# Patient Record
Sex: Female | Born: 1965 | Race: Black or African American | Hispanic: No | Marital: Married | Smoking: Never smoker
Health system: Southern US, Community
[De-identification: ages and names within clinical notes are randomized; demographics above are authoritative.]

## PROBLEM LIST (undated history)

## (undated) DIAGNOSIS — I10 Essential (primary) hypertension: Secondary | ICD-10-CM

## (undated) HISTORY — DX: Essential (primary) hypertension: I10

## (undated) HISTORY — PX: THYROID SURGERY: SHX805

---

## 2013-09-25 DIAGNOSIS — E042 Nontoxic multinodular goiter: Secondary | ICD-10-CM | POA: Insufficient documentation

## 2013-09-25 DIAGNOSIS — Z862 Personal history of diseases of the blood and blood-forming organs and certain disorders involving the immune mechanism: Secondary | ICD-10-CM | POA: Insufficient documentation

## 2015-09-16 DIAGNOSIS — J45909 Unspecified asthma, uncomplicated: Secondary | ICD-10-CM | POA: Insufficient documentation

## 2020-02-01 ENCOUNTER — Encounter (HOSPITAL_COMMUNITY): Payer: Self-pay | Admitting: Emergency Medicine

## 2020-02-01 ENCOUNTER — Emergency Department (HOSPITAL_COMMUNITY)
Admission: EM | Admit: 2020-02-01 | Discharge: 2020-02-01 | Disposition: A | Discharge status: Home / Self Care | Payer: Self-pay | Attending: Emergency Medicine | Admitting: Emergency Medicine

## 2020-02-01 ENCOUNTER — Other Ambulatory Visit: Payer: Self-pay

## 2020-02-01 ENCOUNTER — Emergency Department (HOSPITAL_COMMUNITY): Payer: Self-pay

## 2020-02-01 DIAGNOSIS — Y9301 Activity, walking, marching and hiking: Secondary | ICD-10-CM | POA: Insufficient documentation

## 2020-02-01 DIAGNOSIS — W010XXA Fall on same level from slipping, tripping and stumbling without subsequent striking against object, initial encounter: Secondary | ICD-10-CM | POA: Insufficient documentation

## 2020-02-01 DIAGNOSIS — Y999 Unspecified external cause status: Secondary | ICD-10-CM | POA: Insufficient documentation

## 2020-02-01 DIAGNOSIS — Z880 Allergy status to penicillin: Secondary | ICD-10-CM | POA: Insufficient documentation

## 2020-02-01 DIAGNOSIS — R519 Headache, unspecified: Secondary | ICD-10-CM | POA: Insufficient documentation

## 2020-02-01 DIAGNOSIS — Y92511 Restaurant or cafe as the place of occurrence of the external cause: Secondary | ICD-10-CM | POA: Insufficient documentation

## 2020-02-01 DIAGNOSIS — W19XXXA Unspecified fall, initial encounter: Secondary | ICD-10-CM

## 2020-02-01 DIAGNOSIS — S0083XA Contusion of other part of head, initial encounter: Secondary | ICD-10-CM | POA: Insufficient documentation

## 2020-02-01 MED ORDER — ACETAMINOPHEN 500 MG PO TABS: 1000.0000 mg | ORAL_TABLET | Freq: Once | ORAL | Status: DC

## 2020-02-01 NOTE — ED Provider Notes (Addendum)
[facility]-EMERGENCY DEPT Provider Note   CSN: [id number] Arrival date & time: 02/01/20  2034     History Chief Complaint  Patient presents with  . Fall    Laura Schultz is a 54 y.o. female who presents for evaluation after a mechanical fall.  Patient brought in by EMS from [place] [facility].  She reports that she was walking and states that her feet slipped, causing her to fall.  She reports that she landed on her left side of her face and hit the floor with her head.  She did not lose consciousness.  She reports that she had no preceding chest pain or dizziness prior to onset of fall.  She states that since then, she has had pain to the left side of her head as well as her left side of her face.  She is not on blood thinners.  She denies any vision changes, nausea/vomiting, numbness/weakness, chest pain, difficulty breathing.  The history is provided by the patient.       History reviewed. No pertinent past medical history.  There are no problems to display for this patient.   History reviewed. No pertinent surgical history.   OB History   No obstetric history on file.     History reviewed. No pertinent family history.  Social History   Tobacco Use  . Smoking status: Never Smoker  . Smokeless tobacco: Never Used  Substance Use Topics  . Alcohol use: Not Currently  . Drug use: Not Currently    Home Medications Prior to Admission medications   Not on File    Allergies    Amoxicillin  Review of Systems   Review of Systems  HENT:       Facial pain  Eyes: Negative for visual disturbance.  Gastrointestinal: Negative for nausea and vomiting.  Neurological: Positive for headaches. Negative for weakness and numbness.  All other systems reviewed and are negative.   Physical Exam Updated Vital Signs BP (!) 143/81 (BP Location: Left Arm)   Pulse 88   Temp 98.2 F (36.8 C) (Oral)   Resp 16   Ht 5\' 2"  (1.575 m)   Wt 72.1 kg   SpO2 100%    BMI 29.08 kg/m   Physical Exam Vitals and nursing note reviewed.  Constitutional:      Appearance: Normal appearance. She is well-developed.  HENT:     Head: Normocephalic and atraumatic.      Comments: Tenderness palpation noted today the left temporal region that extends to the zygomatic arch and left mandible.  She also has some tenderness palpation noted to inferior periorbital region. Eyes:     General: Lids are normal.     Conjunctiva/sclera: Conjunctivae normal.     Pupils: Pupils are equal, round, and reactive to light.     Comments: PERRL. EOMs intact. No nystagmus. No neglect.   Neck:     Comments: Tenderness palpation no midline cervical spine.  No deformity or crepitus noted. Cardiovascular:     Rate and Rhythm: Normal rate and regular rhythm.     Pulses: Normal pulses.     Heart sounds: Normal heart sounds. No murmur. No friction rub. No gallop.   Pulmonary:     Effort: Pulmonary effort is normal.     Breath sounds: Normal breath sounds.  Abdominal:     Palpations: Abdomen is soft. Abdomen is not rigid.     Tenderness: There is no abdominal tenderness. There is no guarding.  Musculoskeletal:  General: Normal range of motion.  Skin:    General: Skin is warm and dry.     Capillary Refill: Capillary refill takes less than 2 seconds.  Neurological:     Mental Status: She is alert and oriented to person, place, and time.     Comments: Cranial nerves III-XII intact Follows commands, Moves all extremities  5/5 strength to BUE and BLE  Sensation intact throughout all major nerve distributions No gait abnormalities  No slurred speech. No facial droop.   Psychiatric:        Speech: Speech normal.     ED Results / Procedures / Treatments   Labs (all labs ordered are listed, but only abnormal results are displayed) Labs Reviewed - No data to display  EKG None  Radiology CT Head Wo Contrast  Result Date: 02/01/2020 CLINICAL DATA:  Fall with left facial  strike EXAM: CT HEAD WITHOUT CONTRAST CT MAXILLOFACIAL WITHOUT CONTRAST CT CERVICAL SPINE WITHOUT CONTRAST TECHNIQUE: Multidetector CT imaging of the head, cervical spine, and maxillofacial structures were performed using the standard protocol without intravenous contrast. Multiplanar CT image reconstructions of the cervical spine and maxillofacial structures were also generated. COMPARISON:  None. FINDINGS: CT HEAD FINDINGS Brain: No evidence of acute infarction, hemorrhage, hydrocephalus, extra-axial collection or mass lesion/mass effect. Vascular: Atherosclerotic calcification of the carotid siphons. No hyperdense vessel. Skull: No calvarial fracture or suspicious osseous lesion. No scalp swelling or hematoma. Other: None CT MAXILLOFACIAL FINDINGS Osseous: No fracture of the bony orbits. Nasal bones are intact. No mid face fractures are seen. The pterygoid plates are intact. The mandible is intact. Temporomandibular joints are normally aligned. No temporal bone fractures are identified. No fractured or avulsed teeth. Orbits: Left periorbital swelling and palpebral thickening without retro septal gas, stranding or hemorrhage. The globes appear normal and symmetric. Symmetric appearance of the extraocular musculature and optic nerve sheath complexes. Normal caliber of the superior ophthalmic veins. Sinuses: Paranasal sinuses and mastoid air cells are predominantly clear. Middle ear cavities are clear. Ossicular chains appear normally configured. Soft tissues: There is left periorbital soft tissue swelling and palpebral thickening which extends to the medial nasal bridge on the left. No soft tissue gas or foreign body. No other significant swelling or contusive change. CT CERVICAL SPINE FINDINGS Alignment: Stabilization collar absent at the time of examination. There is notable neck flexion and slight rightward cranial rotation best appreciated on scout imaging. Resulting reversal of the normal cervical lordosis  with an apex at the C5-6 level. No evidence of traumatic listhesis. No abnormally widened, perched or jumped facets. Normal alignment of the craniocervical and atlantoaxial articulations. 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 This is an anatomic variant. No abnormal widening of the atlantodental interval. Spondylitic changes in the spine are present most notably at C5-6 with some larger anterior osteophytes. Vacuum phenomenon seen in the superior endplate C7. No acute fracture or vertebral body height loss. No visible skull base fractures. Soft tissues and spinal canal: No pre or paravertebral fluid or swelling. No visible canal hematoma. Disc levels: Multilevel intervertebral disc height loss with spondylitic endplate changes. Features maximal C5-6 and C6-7 with at most mild canal narrowing secondary to posterior disc osteophyte complexes. Uncinate spurring facet hypertrophic change results in at most mild multilevel neural foraminal narrowing throughout the cervical spine as well. Upper chest: No acute abnormality in the upper chest or imaged lung apices. Other: Diminutive appearance of the right  lobe thyroid gland. No concerning thyroid nodules. IMPRESSION: 1. No acute intracranial abnormality. 2. Left periorbital soft tissue swelling and palpebral thickening. No evidence of orbital or facial bone fracture. 3. No evidence of acute fracture or traumatic listhesis of the cervical spine. 4. 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 This is an anatomic variant. 5. Spondylitic changes and facet degenerative changes of the cervical spine, detailed above. No severe canal or foraminal narrowing. Electronically Signed   By: Kreg Shropshire M.D.   On: 02/01/2020 22:22   CT Cervical Spine Wo Contrast  Result Date: 02/01/2020 CLINICAL DATA:  Fall with left facial strike EXAM: CT HEAD WITHOUT CONTRAST CT  MAXILLOFACIAL WITHOUT CONTRAST CT CERVICAL SPINE WITHOUT CONTRAST TECHNIQUE: Multidetector CT imaging of the head, cervical spine, and maxillofacial structures were performed using the standard protocol without intravenous contrast. Multiplanar CT image reconstructions of the cervical spine and maxillofacial structures were also generated. COMPARISON:  None. FINDINGS: CT HEAD FINDINGS Brain: No evidence of acute infarction, hemorrhage, hydrocephalus, extra-axial collection or mass lesion/mass effect. Vascular: Atherosclerotic calcification of the carotid siphons. No hyperdense vessel. Skull: No calvarial fracture or suspicious osseous lesion. No scalp swelling or hematoma. Other: None CT MAXILLOFACIAL FINDINGS Osseous: No fracture of the bony orbits. Nasal bones are intact. No mid face fractures are seen. The pterygoid plates are intact. The mandible is intact. Temporomandibular joints are normally aligned. No temporal bone fractures are identified. No fractured or avulsed teeth. Orbits: Left periorbital swelling and palpebral thickening without retro septal gas, stranding or hemorrhage. The globes appear normal and symmetric. Symmetric appearance of the extraocular musculature and optic nerve sheath complexes. Normal caliber of the superior ophthalmic veins. Sinuses: Paranasal sinuses and mastoid air cells are predominantly clear. Middle ear cavities are clear. Ossicular chains appear normally configured. Soft tissues: There is left periorbital soft tissue swelling and palpebral thickening which extends to the medial nasal bridge on the left. No soft tissue gas or foreign body. No other significant swelling or contusive change. CT CERVICAL SPINE FINDINGS Alignment: Stabilization collar absent at the time of examination. There is notable neck flexion and slight rightward cranial rotation best appreciated on scout imaging. Resulting reversal of the normal cervical lordosis with an apex at the C5-6 level. No evidence  of traumatic listhesis. No abnormally widened, perched or jumped facets. Normal alignment of the craniocervical and atlantoaxial articulations. 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 This is an anatomic variant. No abnormal widening of the atlantodental interval. Spondylitic changes in the spine are present most notably at C5-6 with some larger anterior osteophytes. Vacuum phenomenon seen in the superior endplate C7. No acute fracture or vertebral body height loss. No visible skull base fractures. Soft tissues and spinal canal: No pre or paravertebral fluid or swelling. No visible canal hematoma. Disc levels: Multilevel intervertebral disc height loss with spondylitic endplate changes. Features maximal C5-6 and C6-7 with at most mild canal narrowing secondary to posterior disc osteophyte complexes. Uncinate spurring facet hypertrophic change results in at most mild multilevel neural foraminal narrowing throughout the cervical spine as well. Upper chest: No acute abnormality in the upper chest or imaged lung apices. Other: Diminutive appearance of the right lobe thyroid gland. No concerning thyroid nodules. IMPRESSION: 1. No acute intracranial abnormality. 2. Left periorbital soft tissue swelling and palpebral thickening. No evidence of orbital or facial bone fracture. 3. No evidence of acute fracture or  traumatic listhesis of the cervical spine. 4. 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 This is an anatomic variant. 5. Spondylitic changes and facet degenerative changes of the cervical spine, detailed above. No severe canal or foraminal narrowing. Electronically Signed   By: Lovena Le M.D.   On: 02/01/2020 22:22   CT Maxillofacial Wo Contrast  Result Date: 02/01/2020 CLINICAL DATA:  Fall with left facial strike EXAM: CT HEAD WITHOUT CONTRAST CT MAXILLOFACIAL WITHOUT CONTRAST CT CERVICAL SPINE WITHOUT  CONTRAST TECHNIQUE: Multidetector CT imaging of the head, cervical spine, and maxillofacial structures were performed using the standard protocol without intravenous contrast. Multiplanar CT image reconstructions of the cervical spine and maxillofacial structures were also generated. COMPARISON:  None. FINDINGS: CT HEAD FINDINGS Brain: No evidence of acute infarction, hemorrhage, hydrocephalus, extra-axial collection or mass lesion/mass effect. Vascular: Atherosclerotic calcification of the carotid siphons. No hyperdense vessel. Skull: No calvarial fracture or suspicious osseous lesion. No scalp swelling or hematoma. Other: None CT MAXILLOFACIAL FINDINGS Osseous: No fracture of the bony orbits. Nasal bones are intact. No mid face fractures are seen. The pterygoid plates are intact. The mandible is intact. Temporomandibular joints are normally aligned. No temporal bone fractures are identified. No fractured or avulsed teeth. Orbits: Left periorbital swelling and palpebral thickening without retro septal gas, stranding or hemorrhage. The globes appear normal and symmetric. Symmetric appearance of the extraocular musculature and optic nerve sheath complexes. Normal caliber of the superior ophthalmic veins. Sinuses: Paranasal sinuses and mastoid air cells are predominantly clear. Middle ear cavities are clear. Ossicular chains appear normally configured. Soft tissues: There is left periorbital soft tissue swelling and palpebral thickening which extends to the medial nasal bridge on the left. No soft tissue gas or foreign body. No other significant swelling or contusive change. CT CERVICAL SPINE FINDINGS Alignment: Stabilization collar absent at the time of examination. There is notable neck flexion and slight rightward cranial rotation best appreciated on scout imaging. Resulting reversal of the normal cervical lordosis with an apex at the C5-6 level. No evidence of traumatic listhesis. No abnormally widened, perched or  jumped facets. Normal alignment of the craniocervical and atlantoaxial articulations. 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 This is an anatomic variant. No abnormal widening of the atlantodental interval. Spondylitic changes in the spine are present most notably at C5-6 with some larger anterior osteophytes. Vacuum phenomenon seen in the superior endplate C7. No acute fracture or vertebral body height loss. No visible skull base fractures. Soft tissues and spinal canal: No pre or paravertebral fluid or swelling. No visible canal hematoma. Disc levels: Multilevel intervertebral disc height loss with spondylitic endplate changes. Features maximal C5-6 and C6-7 with at most mild canal narrowing secondary to posterior disc osteophyte complexes. Uncinate spurring facet hypertrophic change results in at most mild multilevel neural foraminal narrowing throughout the cervical spine as well. Upper chest: No acute abnormality in the upper chest or imaged lung apices. Other: Diminutive appearance of the right lobe thyroid gland. No concerning thyroid nodules. IMPRESSION: 1. No acute intracranial abnormality. 2. Left periorbital soft tissue swelling and palpebral thickening. No evidence of orbital or facial bone fracture. 3. No evidence of acute fracture or traumatic listhesis of the cervical spine. 4. 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 This is an anatomic variant. 5. Spondylitic changes and facet degenerative changes of the cervical spine, detailed above.  No severe canal or foraminal narrowing. Electronically Signed   By: Kreg ShropshirePrice  DeHay M.D.   On: 02/01/2020 22:22    Procedures Procedures (including critical care time)  Medications Ordered in ED Medications  acetaminophen (TYLENOL) tablet 1,000 mg (has no administration in time range)    ED Course  I have reviewed the triage vital signs and the  nursing notes.  Pertinent labs & imaging results that were available during my care of the patient were reviewed by me and considered in my medical decision making (see chart for details).    MDM Rules/Calculators/A&P                      54 year old female who presents for evaluation after mechanical fall.  Reports that she slipped and landed on her left side of her face and head.  No preceding chest pain or dizziness.  She is not on blood thinners.  Denies any LOC.  On initial arrival, she is afebrile, nontoxic-appearing.  Vital signs are stable.  No neuro deficits.  She has significant tenderness into the left side of her head as well as her face into her periorbital region and zygomatic arch.  Given extreme tenderness, will plan for imaging for evaluation.  CT scans show no acute intracranial normality.  She has left periorbital soft tissue swelling and palpable thickening.  No evidence of fracture.  No evidence of acute fracture or traumatic injury of the C-spine.  Discussed results with patient.  Encouraged at home supportive care measures. At this time, patient exhibits no emergent life-threatening condition that require further evaluation in ED or admission. Patient had ample opportunity for questions and discussion. All patient's questions were answered with full understanding. Strict return precautions discussed. Patient expresses understanding and agreement to plan.   Portions of this note were generated with Scientist, clinical (histocompatibility and immunogenetics)Dragon dictation software. Dictation errors may occur despite best attempts at proofreading.  Final Clinical Impression(s) / ED Diagnoses Final diagnoses:  Fall, initial encounter  Contusion of face, initial encounter    Rx / DC Orders ED Discharge Orders    None       Maxwell CaulLayden, Tadarrius Burch A, PA-C 02/01/20 2304    Maxwell CaulLayden, Anel Creighton A, PA-C 02/01/20 2305    Terrilee FilesButler, Michael C, MD 02/02/20 [phone]

## 2020-02-01 NOTE — Discharge Instructions (Addendum)
As we discussed, your imaging looked reassuring.  You will likely have more swelling tomorrow.  He can apply ice to help with swelling.  You can take Tylenol or Ibuprofen as directed for pain. You can alternate Tylenol and Ibuprofen every 4 hours. If you take Tylenol at 1pm, then you can take Ibuprofen at 5pm. Then you can take Tylenol again at 9pm.   Follow-up with [facility] to establish a primary care doctor if you do not have one.   Return to the emergency department for any worsening pain, nausea/vomiting, numbness/weakness or any other worsening concerning events.

## 2020-02-01 NOTE — ED Triage Notes (Signed)
Patient came from [place] [facility]. Patient feet slipped off in front of her. Patient fell on buttocks and then the left side of face hit the floor. Patient did not loose consciousness and or dizziness. The only complaint is a headache.

## 2020-02-01 NOTE — ED Notes (Signed)
Pt taken to CT.

## 2020-02-29 NOTE — Progress Notes (Deleted)
   Subjective:    Patient ID: Laura Schultz, female    DOB: 1966/04/25, 53 y.o.   MRN: [id number]  54 y.o.M here to establish s/p fall in 01/2020     Review of Systems     Objective:   Physical Exam        Assessment & Plan:

## 2020-03-01 ENCOUNTER — Ambulatory Visit: Payer: Self-pay | Admitting: Critical Care Medicine

## 2020-03-20 NOTE — Progress Notes (Signed)
Subjective:    Patient ID: Laura Schultz, female    DOB: 03-07-1966, 54 y.o.   MRN: [id number]  54 y.o.F here for post hosp f/u Patient was in the urgent care July 15 with complaints of malaise and fatigue.  Thyroid function complete blood count and metabolic panel were all normal and urinalysis was normal EKG also was normal  The patient noted that she had previously had a fall sliding and indoor restaurant on a wet floor and hitting her head face and shoulder.  She has been to the urgent care couple times since that initial visit.  She had a CT scan of her head neck and face without significant fracture seen.  Patient states she still having some pain in the top of her head still having left facial discomfort.  The patient is here to establish for primary care at this time.  She has no known history of hypertension but comes in today with blood pressure 154/81 saturations 100% room air   No past medical history on file.   No family history on file.   Social History   Socioeconomic History  . Marital status: Married    Spouse name: Not on file  . Number of children: Not on file  . Years of education: Not on file  . Highest education level: Not on file  Occupational History  . Not on file  Tobacco Use  . Smoking status: Never Smoker  . Smokeless tobacco: Never Used  Vaping Use  . Vaping Use: Never used  Substance and Sexual Activity  . Alcohol use: Not Currently  . Drug use: Not Currently  . Sexual activity: Not on file  Other Topics Concern  . Not on file  Social History Narrative  . Not on file   Social Determinants of Health   Financial Resource Strain:   . Difficulty of Paying Living Expenses:   Food Insecurity:   . Worried About Programme researcher, broadcasting/film/video in the Last Year:   . Barista in the Last Year:   Transportation Needs:   . Freight forwarder (Medical):   Marland Kitchen Lack of Transportation (Non-Medical):   Physical Activity:   . Days of Exercise per  Week:   . Minutes of Exercise per Session:   Stress:   . Feeling of Stress :   Social Connections:   . Frequency of Communication with Friends and Family:   . Frequency of Social Gatherings with Friends and Family:   . Attends Religious Services:   . Active Member of Clubs or Organizations:   . Attends Banker Meetings:   Marland Kitchen Marital Status:   Intimate Partner Violence:   . Fear of Current or Ex-Partner:   . Emotionally Abused:   Marland Kitchen Physically Abused:   . Sexually Abused:      Allergies  Allergen Reactions  . Amoxicillin Swelling     Outpatient Medications Prior to Visit  Medication Sig Dispense Refill  . ibuprofen (ADVIL) 200 MG tablet Take 200 mg by mouth every 6 (six) hours as needed.     No facility-administered medications prior to visit.      Review of Systems  Constitutional: Positive for activity change and fatigue. Negative for fever.  HENT: Positive for facial swelling. Negative for congestion, dental problem, drooling, ear discharge, ear pain, hearing loss, mouth sores, nosebleeds, postnasal drip and rhinorrhea.   Eyes: Negative.   Respiratory: Negative.   Cardiovascular: Negative.   Gastrointestinal: Negative.  Endocrine: Negative.   Genitourinary: Negative.   Musculoskeletal: Negative.   Neurological: Positive for headaches.  Hematological: Negative.   Psychiatric/Behavioral: Negative.        Objective:   Physical Exam Vitals:   03/21/20 0954  BP: (!) 154/81  Pulse: 67  Resp: 16  Temp: (!) 97.5 F (36.4 C)  SpO2: 100%  Weight: 160 lb (72.6 kg)    Gen: Pleasant, well-nourished, in no distress,  normal affect  ENT: No lesions,  mouth clear,  oropharynx clear, no postnasal drip  Neck: No JVD, no TMG, no carotid bruits  Lungs: No use of accessory muscles, no dullness to percussion, clear without rales or rhonchi  Cardiovascular: RRR, heart sounds normal, no murmur or gallops, no peripheral edema  Abdomen: soft and NT, no HSM,   BS normal  Musculoskeletal: No deformities, no cyanosis or clubbing  Neuro: alert, non focal  Skin: Warm, no lesions or rashes        Assessment & Plan:  I personally reviewed all images and lab data in the [facility] system as well as any outside material available during this office visit and agree with the  radiology impressions.   Head trauma History of head trauma previously but at this stage no evidence of chronic sequelae  I counseled the patient to use ibuprofen only for her headaches no additional work-up is indicated   Diagnoses and all orders for this visit:  Traumatic injury of head, sequela  Need for hepatitis C screening test -     Hepatitis C antibody  Encounter for screening mammogram for malignant neoplasm of breast -     MM DIGITAL SCREENING BILATERAL; Future  Colon cancer screening -     Fecal occult blood, imunochemical  Encounter for screening for HIV -     HIV Antibody (routine testing w rflx)   From a primary care perspective plan will be for this patient to obtain fecal occult blood study, hepatitis C, HIV study and schedule a mammogram  Plan to monitor BP

## 2020-03-21 ENCOUNTER — Ambulatory Visit: Payer: Self-pay | Attending: Critical Care Medicine | Admitting: Critical Care Medicine

## 2020-03-21 ENCOUNTER — Other Ambulatory Visit: Payer: Self-pay

## 2020-03-21 ENCOUNTER — Encounter: Payer: Self-pay | Admitting: Critical Care Medicine

## 2020-03-21 VITALS — BP 154/81 | HR 67 | Temp 97.5°F | Resp 16 | Wt 160.0 lb

## 2020-03-21 DIAGNOSIS — F0781 Postconcussional syndrome: Secondary | ICD-10-CM | POA: Insufficient documentation

## 2020-03-21 DIAGNOSIS — Z1231 Encounter for screening mammogram for malignant neoplasm of breast: Secondary | ICD-10-CM

## 2020-03-21 DIAGNOSIS — Z1159 Encounter for screening for other viral diseases: Secondary | ICD-10-CM

## 2020-03-21 DIAGNOSIS — Z114 Encounter for screening for human immunodeficiency virus [HIV]: Secondary | ICD-10-CM

## 2020-03-21 DIAGNOSIS — Z1211 Encounter for screening for malignant neoplasm of colon: Secondary | ICD-10-CM

## 2020-03-21 DIAGNOSIS — S0990XS Unspecified injury of head, sequela: Secondary | ICD-10-CM

## 2020-03-21 NOTE — Progress Notes (Signed)
C/o headache when she bends her head. Swelling and tenderness to touch on the left side of face.

## 2020-03-21 NOTE — Assessment & Plan Note (Signed)
History of head trauma previously but at this stage no evidence of chronic sequelae  I counseled the patient to use ibuprofen only for her headaches no additional work-up is indicated

## 2020-03-21 NOTE — Patient Instructions (Addendum)
Do neck exercises as below Take ibuprofen as needed for pain in head and face Labs today:  Hepatitis C and HIV screen A mammogram will be ordered A fecal occult screen will be ordered A pap smear will be ordered  A Tetanus vaccine was given  Return to Dr Delford Field 2 months  See below for diet for weight loss   Neck Exercises Ask your health care provider which exercises are safe for you. Do exercises exactly as told by your health care provider and adjust them as directed. It is normal to feel mild stretching, pulling, tightness, or discomfort as you do these exercises. Stop right away if you feel sudden pain or your pain gets worse. Do not begin these exercises until told by your health care provider. Neck exercises can be important for many reasons. They can improve strength and maintain flexibility in your neck, which will help your upper back and prevent neck pain. Stretching exercises Rotation neck stretching  1. Sit in a chair or stand up. 2. Place your feet flat on the floor, shoulder width apart. 3. Slowly turn your head (rotate) to the right until a slight stretch is felt. Turn it all the way to the right so you can look over your right shoulder. Do not tilt or tip your head. 4. Hold this position for 10-30 seconds. 5. Slowly turn your head (rotate) to the left until a slight stretch is felt. Turn it all the way to the left so you can look over your left shoulder. Do not tilt or tip your head. 6. Hold this position for 10-30 seconds. Repeat __________ times. Complete this exercise __________ times a day. Neck retraction 1. Sit in a sturdy chair or stand up. 2. Look straight ahead. Do not bend your neck. 3. Use your fingers to push your chin backward (retraction). Do not bend your neck for this movement. Continue to face straight ahead. If you are doing the exercise properly, you will feel a slight sensation in your throat and a stretch at the back of your neck. 4. Hold the  stretch for 1-2 seconds. Repeat __________ times. Complete this exercise __________ times a day. Strengthening exercises Neck press 1. Lie on your back on a firm bed or on the floor with a pillow under your head. 2. Use your neck muscles to push your head down on the pillow and straighten your spine. 3. Hold the position as well as you can. Keep your head facing up (in a neutral position) and your chin tucked. 4. Slowly count to 5 while holding this position. Repeat __________ times. Complete this exercise __________ times a day. Isometrics These are exercises in which you strengthen the muscles in your neck while keeping your neck still (isometrics). 1. Sit in a supportive chair and place your hand on your forehead. 2. Keep your head and face facing straight ahead. Do not flex or extend your neck while doing isometrics. 3. Push forward with your head and neck while pushing back with your hand. Hold for 10 seconds. 4. Do the sequence again, this time putting your hand against the back of your head. Use your head and neck to push backward against the hand pressure. 5. Finally, do the same exercise on either side of your head, pushing sideways against the pressure of your hand. Repeat __________ times. Complete this exercise __________ times a day. Prone head lifts 1. Lie face-down (prone position), resting on your elbows so that your chest and upper back are  raised. 2. Start with your head facing downward, near your chest. Position your chin either on or near your chest. 3. Slowly lift your head upward. Lift until you are looking straight ahead. Then continue lifting your head as far back as you can comfortably stretch. 4. Hold your head up for 5 seconds. Then slowly lower it to your starting position. Repeat __________ times. Complete this exercise __________ times a day. Supine head lifts 1. Lie on your back (supine position), bending your knees to point to the ceiling and keeping your feet  flat on the floor. 2. Lift your head slowly off the floor, raising your chin toward your chest. 3. Hold for 5 seconds. Repeat __________ times. Complete this exercise __________ times a day. Scapular retraction 1. Stand with your arms at your sides. Look straight ahead. 2. Slowly pull both shoulders (scapulae) backward and downward (retraction) until you feel a stretch between your shoulder blades in your upper back. 3. Hold for 10-30 seconds. 4. Relax and repeat. Repeat __________ times. Complete this exercise __________ times a day. Contact a health care provider if:  Your neck pain or discomfort gets much worse when you do an exercise.  Your neck pain or discomfort does not improve within 2 hours after you exercise. If you have any of these problems, stop exercising right away. Do not do the exercises again unless your health care provider says that you can. Get help right away if:  You develop sudden, severe neck pain. If this happens, stop exercising right away. Do not do the exercises again unless your health care provider says that you can. This information is not intended to replace advice given to you by your health care provider. Make sure you discuss any questions you have with your health care provider. Document Revised: 06/11/2018 Document Reviewed: 06/11/2018 Elsevier Patient Education  2020 Elsevier Inc.   Preventing Unhealthy Kinder Morgan EnergyWeight Gain, Adult Staying at a healthy weight is important to your overall health. When fat builds up in your body, you may become overweight or obese. Being overweight or obese increases your risk of developing certain health problems, such as heart disease, diabetes, sleeping problems, joint problems, and some types of cancer. Unhealthy weight gain is often the result of making unhealthy food choices or not getting enough exercise. You can make changes to your lifestyle to prevent obesity and stay as healthy as possible. What nutrition changes can  be made?   Eat only as much as your body needs. To do this: ? Pay attention to signs that you are hungry or full. Stop eating as soon as you feel full. ? If you feel hungry, try drinking water first before eating. Drink enough water so your urine is clear or pale yellow. ? Eat smaller portions. Pay attention to portion sizes when eating out. ? Look at serving sizes on food labels. Most foods contain more than one serving per container. ? Eat the recommended number of calories for your gender and activity level. For most active people, a daily total of 2,000 calories is appropriate. If you are trying to lose weight or are not very active, you may need to eat fewer calories. Talk with your health care provider or a diet and nutrition specialist (dietitian) about how many calories you need each day.  Choose healthy foods, such as: ? Fruits and vegetables. At each meal, try to fill at least half of your plate with fruits and vegetables. ? Whole grains, such as whole-wheat bread, brown rice,  and quinoa. ? Lean meats, such as chicken or fish. ? Other healthy proteins, such as beans, eggs, or tofu. ? Healthy fats, such as nuts, seeds, fatty fish, and olive oil. ? Low-fat or fat-free dairy products.  Check food labels, and avoid food and drinks that: ? Are high in calories. ? Have added sugar. ? Are high in sodium. ? Have saturated fats or trans fats.  Cook foods in healthier ways, such as by baking, broiling, or grilling.  Make a meal plan for the week, and shop with a grocery list to help you stay on track with your purchases. Try to avoid going to the grocery store when you are hungry.  When grocery shopping, try to shop around the outside of the store first, where the fresh foods are. Doing this helps you to avoid prepackaged foods, which can be high in sugar, salt (sodium), and fat. What lifestyle changes can be made?   Exercise for 30 or more minutes on 5 or more days each week.  Exercising may include brisk walking, yard work, biking, running, swimming, and team sports like basketball and soccer. Ask your health care provider which exercises are safe for you.  Do muscle-strengthening activities, such as lifting weights or using resistance bands, on 2 or more days a week.  Do not use any products that contain nicotine or tobacco, such as cigarettes and e-cigarettes. If you need help quitting, ask your health care provider.  Limit alcohol intake to no more than 1 drink a day for nonpregnant women and 2 drinks a day for men. One drink equals 12 oz of beer, 5 oz of wine, or 1 oz of hard liquor.  Try to get 7-9 hours of sleep each night. What other changes can be made?  Keep a food and activity journal to keep track of: ? What you ate and how many calories you had. Remember to count the calories in sauces, dressings, and side dishes. ? Whether you were active, and what exercises you did. ? Your calorie, weight, and activity goals.  Check your weight regularly. Track any changes. If you notice you have gained weight, make changes to your diet or activity routine.  Avoid taking weight-loss medicines or supplements. Talk to your health care provider before starting any new medicine or supplement.  Talk to your health care provider before trying any new diet or exercise plan. Why are these changes important? Eating healthy, staying active, and having healthy habits can help you to prevent obesity. Those changes also:  Help you manage stress and emotions.  Help you connect with friends and family.  Improve your self-esteem.  Improve your sleep.  Prevent long-term health problems. What can happen if changes are not made? Being obese or overweight can cause you to develop joint or bone problems, which can make it hard for you to stay active or do activities you enjoy. Being obese or overweight also puts stress on your heart and lungs and can lead to health problems  like diabetes, heart disease, and some cancers. Where to find more information Talk with your health care provider or a dietitian about healthy eating and healthy lifestyle choices. You may also find information from:  U.S. Department of Agriculture, MyPlate: https://ball-collins.biz/  American Heart Association: www.heart.org  Centers for Disease Control and Prevention: FootballExhibition.com.br Summary  Staying at a healthy weight is important to your overall health. It helps you to prevent certain diseases and health problems, such as heart disease, diabetes, joint problems,  sleep disorders, and some types of cancer.  Being obese or overweight can cause you to develop joint or bone problems, which can make it hard for you to stay active or do activities you enjoy.  You can prevent unhealthy weight gain by eating a healthy diet, exercising regularly, not smoking, limiting alcohol, and getting enough sleep.  Talk with your health care provider or a dietitian for guidance about healthy eating and healthy lifestyle choices. This information is not intended to replace advice given to you by your health care provider. Make sure you discuss any questions you have with your health care provider. Document Revised: 08/16/2017 Document Reviewed: 09/19/2016 Elsevier Patient Education  2020 [employer].

## 2020-03-22 LAB — HIV ANTIBODY (ROUTINE TESTING W REFLEX): HIV Screen 4th Generation wRfx: NONREACTIVE

## 2020-03-22 LAB — HEPATITIS C ANTIBODY: Hep C Virus Ab: <0.1 s/co ratio (ref 0.0–0.9)

## 2020-03-24 ENCOUNTER — Other Ambulatory Visit: Payer: Self-pay | Admitting: Critical Care Medicine

## 2020-03-24 ENCOUNTER — Other Ambulatory Visit: Payer: Self-pay | Admitting: *Deleted

## 2020-03-24 DIAGNOSIS — Z1231 Encounter for screening mammogram for malignant neoplasm of breast: Secondary | ICD-10-CM

## 2020-03-29 ENCOUNTER — Other Ambulatory Visit: Payer: Self-pay

## 2020-03-29 ENCOUNTER — Ambulatory Visit
Admission: RE | Admit: 2020-03-29 | Discharge: 2020-03-29 | Disposition: A | Discharge status: Home / Self Care | Payer: No Typology Code available for payment source | Source: Ambulatory Visit | Attending: Obstetrics and Gynecology | Admitting: Obstetrics and Gynecology

## 2020-03-29 DIAGNOSIS — Z1231 Encounter for screening mammogram for malignant neoplasm of breast: Secondary | ICD-10-CM

## 2020-03-31 ENCOUNTER — Telehealth: Payer: Self-pay | Admitting: *Deleted

## 2020-03-31 NOTE — Telephone Encounter (Signed)
Per Dr. Delford Field, patient was called today by him and informed of mammogran results. Attempt to call mammogram program to see who would reach out to patient about f/u and next steps.

## 2020-04-01 NOTE — Telephone Encounter (Signed)
Spoke with Gunnar Fusi wit the [employer]. Per Gunnar Fusi, she will f/u with patient. Since she is uninsured, patient will need a pink card for diagnostic mammogram.

## 2020-04-01 NOTE — Telephone Encounter (Signed)
How do we get her a pink card??

## 2020-04-03 NOTE — Telephone Encounter (Signed)
THank you!

## 2020-04-04 ENCOUNTER — Other Ambulatory Visit: Payer: Self-pay | Admitting: Critical Care Medicine

## 2020-04-04 DIAGNOSIS — R5381 Other malaise: Secondary | ICD-10-CM

## 2020-04-05 ENCOUNTER — Telehealth: Payer: Self-pay

## 2020-04-05 NOTE — Telephone Encounter (Signed)
Telephoned patient at home number. Patient did not qualify for BCCCP based on the information given.

## 2020-04-06 ENCOUNTER — Telehealth: Payer: Self-pay

## 2020-04-06 LAB — FECAL OCCULT BLOOD, IMMUNOCHEMICAL: Fecal Occult Bld: NEGATIVE

## 2020-04-06 NOTE — Telephone Encounter (Signed)
Contacted pt to go over lab results pt is aware and doesn't have any questions or concerns 

## 2020-05-10 ENCOUNTER — Telehealth: Payer: Self-pay

## 2020-05-10 NOTE — Telephone Encounter (Signed)
Telephoned daughter([place]) at her mobile number. Left a voice message with BCCCP contact information.

## 2020-05-23 ENCOUNTER — Other Ambulatory Visit: Payer: Self-pay | Admitting: *Deleted

## 2020-05-23 DIAGNOSIS — N6310 Unspecified lump in the right breast, unspecified quadrant: Secondary | ICD-10-CM

## 2020-05-23 DIAGNOSIS — N6320 Unspecified lump in the left breast, unspecified quadrant: Secondary | ICD-10-CM

## 2020-05-24 ENCOUNTER — Ambulatory Visit: Payer: Self-pay | Attending: Critical Care Medicine | Admitting: Critical Care Medicine

## 2020-05-24 ENCOUNTER — Other Ambulatory Visit: Payer: Self-pay

## 2020-05-24 ENCOUNTER — Encounter: Payer: Self-pay | Admitting: Critical Care Medicine

## 2020-05-24 VITALS — BP 152/70 | HR 87 | Temp 98.2°F | Resp 16 | Wt 166.6 lb

## 2020-05-24 DIAGNOSIS — N63 Unspecified lump in unspecified breast: Secondary | ICD-10-CM

## 2020-05-24 DIAGNOSIS — N6009 Solitary cyst of unspecified breast: Secondary | ICD-10-CM | POA: Insufficient documentation

## 2020-05-24 DIAGNOSIS — F0781 Postconcussional syndrome: Secondary | ICD-10-CM

## 2020-05-24 DIAGNOSIS — I10 Essential (primary) hypertension: Secondary | ICD-10-CM

## 2020-05-24 MED ORDER — ACETAMINOPHEN 500 MG PO TABS: 500.0000 mg | ORAL_TABLET | Freq: Three times a day (TID) | ORAL | 1 refills | Status: AC

## 2020-05-24 MED ORDER — METOPROLOL SUCCINATE ER 25 MG PO TB24
25.0000 mg | ORAL_TABLET | Freq: Every day | ORAL | 3 refills | Status: AC
Start: 2020-05-24 — End: ?

## 2020-05-24 MED FILL — METOPROLOL SUCCINATE ER 25: 25 | 30 days supply | Qty: 30 | Fill #0

## 2020-05-24 NOTE — Assessment & Plan Note (Signed)
Bilateral breast mass follow-up with biopsy per breast center

## 2020-05-24 NOTE — Patient Instructions (Signed)
You declined the flu vaccine  Take tylenol one three times a day for headaches  Start metoprolol 25mg  daily for blood pressure  Fill out financial assistance paperwork asap, after this is completed we will order a neurology referral and MRI of brain  Keep your appointment with breast center for the breast biopsy  A pap smear will be scheduled

## 2020-05-24 NOTE — Assessment & Plan Note (Signed)
Begin metoprolol 25 mg daily

## 2020-05-24 NOTE — Assessment & Plan Note (Signed)
Symptom complex most consistent with postconcussive syndrome.  Patient will need financial assistance we can refer to neurology and obtain an MRI.  In the interim we will prescribe Tylenol 500 mg 3 times daily on a scheduled basis and begin metoprolol at 25 mg daily to treat blood pressure elevation and the concussive syndrome

## 2020-05-24 NOTE — Progress Notes (Signed)
Subjective:    Patient ID: Laura Schultz, female    DOB: 03-02-1966, 54 y.o.   MRN: [id number]  03/21/20 54 y.o.F here for post hosp f/u Patient was in the urgent care July 15 with complaints of malaise and fatigue.  Thyroid function complete blood count and metabolic panel were all normal and urinalysis was normal EKG also was normal  The patient noted that she had previously had a fall sliding and indoor restaurant on a wet floor and hitting her head face and shoulder.  She has been to the urgent care couple times since that initial visit.  She had a CT scan of her head neck and face without significant fracture seen.  Patient states she still having some pain in the top of her head still having left facial discomfort.  The patient is here to establish for primary care at this time.  She has no known history of hypertension but comes in today with blood pressure 154/81 saturations 100% room air  05/24/2020 Patient returns in follow-up and notes history of trauma to the head after a fall this summer.  She still having headaches when she looks up with her vision.  Patient also has hypertension and has not been treated and today comes in at 152/70.  Note also she had a screening mammogram which showed lesions in both breasts and she has an ultrasound of the axillas and diagnostic mammogram with potential biopsy upcoming in a week.  Her fecal occult studies for stool were negative.  HIV hep C were negative.  She does not have any insurance.  She appears to have a postconcussive type syndrome and we have been reluctant to order MRI on this patient so she can achieve financial assistance.  She does need a Pap smear at this time. Note TSH previously been checked was normal with prior history of multinodular goiter   No past medical history on file.   No family history on file.   Social History   Socioeconomic History  . Marital status: Married    Spouse name: Not on file  . Number of  children: Not on file  . Years of education: Not on file  . Highest education level: Not on file  Occupational History  . Not on file  Tobacco Use  . Smoking status: Never Smoker  . Smokeless tobacco: Never Used  Vaping Use  . Vaping Use: Never used  Substance and Sexual Activity  . Alcohol use: Not Currently  . Drug use: Not Currently  . Sexual activity: Not on file  Other Topics Concern  . Not on file  Social History Narrative  . Not on file   Social Determinants of Health   Financial Resource Strain:   . Difficulty of Paying Living Expenses: Not on file  Food Insecurity:   . Worried About Programme researcher, broadcasting/film/video in the Last Year: Not on file  . Ran Out of Food in the Last Year: Not on file  Transportation Needs:   . Lack of Transportation (Medical): Not on file  . Lack of Transportation (Non-Medical): Not on file  Physical Activity:   . Days of Exercise per Week: Not on file  . Minutes of Exercise per Session: Not on file  Stress:   . Feeling of Stress : Not on file  Social Connections:   . Frequency of Communication with Friends and Family: Not on file  . Frequency of Social Gatherings with Friends and Family: Not on file  .  Attends Religious Services: Not on file  . Active Member of Clubs or Organizations: Not on file  . Attends Banker Meetings: Not on file  . Marital Status: Not on file  Intimate Partner Violence:   . Fear of Current or Ex-Partner: Not on file  . Emotionally Abused: Not on file  . Physically Abused: Not on file  . Sexually Abused: Not on file     Allergies  Allergen Reactions  . Amoxicillin Swelling     Outpatient Medications Prior to Visit  Medication Sig Dispense Refill  . ibuprofen (ADVIL) 200 MG tablet Take 200 mg by mouth every 6 (six) hours as needed.     No facility-administered medications prior to visit.      Review of Systems  Constitutional: Positive for activity change and fatigue. Negative for fever.  HENT:  Positive for facial swelling. Negative for congestion, dental problem, drooling, ear discharge, ear pain, hearing loss, mouth sores, nosebleeds, postnasal drip and rhinorrhea.   Eyes: Negative.   Respiratory: Negative.   Cardiovascular: Negative.   Gastrointestinal: Negative.   Endocrine: Negative.   Genitourinary: Negative.   Musculoskeletal: Negative.   Neurological: Positive for headaches.  Hematological: Negative.   Psychiatric/Behavioral: Negative.        Objective:   Physical Exam Vitals:   05/24/20 1111  BP: (!) 152/70  Pulse: 87  Resp: 16  Temp: 98.2 F (36.8 C)  SpO2: 98%  Weight: 166 lb 9.6 oz (75.6 kg)    Gen: Pleasant, well-nourished, in no distress,  normal affect  ENT: No lesions,  mouth clear,  oropharynx clear, no postnasal drip  Neck: No JVD, no TMG, no carotid bruits  Lungs: No use of accessory muscles, no dullness to percussion, clear without rales or rhonchi  Cardiovascular: RRR, heart sounds normal, no murmur or gallops, no peripheral edema  Abdomen: soft and NT, no HSM,  BS normal  Musculoskeletal: No deformities, no cyanosis or clubbing  Neuro: alert, non focal  Skin: Warm, no lesions or rashes        Assessment & Plan:  I personally reviewed all images and lab data in the [facility] system as well as any outside material available during this office visit and agree with the  radiology impressions.   Postconcussive syndrome Symptom complex most consistent with postconcussive syndrome.  Patient will need financial assistance we can refer to neurology and obtain an MRI.  In the interim we will prescribe Tylenol 500 mg 3 times daily on a scheduled basis and begin metoprolol at 25 mg daily to treat blood pressure elevation and the concussive syndrome  Hypertension Begin metoprolol 25 mg daily  Breast mass Bilateral breast mass follow-up with biopsy per breast center   Alsie was seen today for follow-up.  Diagnoses and all orders for  this visit:  Postconcussive syndrome  Essential hypertension  Breast mass  Other orders -     acetaminophen (TYLENOL) 500 MG tablet; Take 1 tablet (500 mg total) by mouth in the morning, at noon, and at bedtime. -     metoprolol succinate (TOPROL-XL) 25 MG 24 hr tablet; Take 1 tablet (25 mg total) by mouth daily.   Patient to follow-up with breast evaluation and will obtain a Pap smear

## 2020-05-31 ENCOUNTER — Ambulatory Visit
Admission: RE | Admit: 2020-05-31 | Discharge: 2020-05-31 | Disposition: A | Discharge status: Home / Self Care | Payer: No Typology Code available for payment source | Source: Ambulatory Visit | Attending: Obstetrics and Gynecology | Admitting: Obstetrics and Gynecology

## 2020-05-31 ENCOUNTER — Ambulatory Visit: Payer: Self-pay | Admitting: *Deleted

## 2020-05-31 ENCOUNTER — Other Ambulatory Visit: Payer: Self-pay

## 2020-05-31 VITALS — BP 128/88 | Temp 97.1°F | Wt 164.4 lb

## 2020-05-31 DIAGNOSIS — N6320 Unspecified lump in the left breast, unspecified quadrant: Secondary | ICD-10-CM

## 2020-05-31 DIAGNOSIS — Z01419 Encounter for gynecological examination (general) (routine) without abnormal findings: Secondary | ICD-10-CM

## 2020-05-31 DIAGNOSIS — N6310 Unspecified lump in the right breast, unspecified quadrant: Secondary | ICD-10-CM

## 2020-05-31 NOTE — Patient Instructions (Signed)
Explained breast self awareness with Laura Schultz. Pap smear completed today. Let her know BCCCP will cover Pap smears and HPV typing every 5 years unless has a history of abnormal Pap smears. Referred patient to the [facility] for a diagnostic mammogram per recommendation. Appointment scheduled Tuesday, May 31, 2020 at 1340. Patient aware of appointment and will be there. Let patient know will follow up with her within the next couple weeks with results of her Pap smear by letter or phone. [name] verbalized understanding.  [name], Kathaleen Maser, RN 12:13 PM

## 2020-05-31 NOTE — Progress Notes (Signed)
[name] is a 54 y.o. No obstetric history on file. female who presents to [facility] clinic today with no complaints.    Pap Smear: Pap smear completed today. Last Pap smear was 09/10/2012 and was normal. Per patient has no history of an abnormal Pap smear. Last Pap smear result is available in Epic.   Physical exam: Breasts Right breast slightly larger than left breast that per patient is normal for her. No skin abnormalities bilateral breasts. No nipple retraction bilateral breasts. No nipple discharge bilateral breasts. No lymphadenopathy. No lumps palpated bilateral breasts. No complaints of pain or tenderness on exam.       Pelvic/Bimanual Ext Genitalia No lesions, no swelling and no discharge observed on external genitalia.        Vagina Vagina pink and normal texture. No lesions or discharge observed in vagina.        Cervix Cervix is present. Cervix pink and of normal texture. No discharge observed.    Uterus Uterus is present and palpable. Uterus in normal position and normal size.        Adnexae Bilateral ovaries present and palpable. No tenderness on palpation.         Rectovaginal No rectal exam completed today since patient had no rectal complaints. No skin abnormalities observed on exam.     Smoking History: Patient has never smoked.   Patient Navigation: Patient education provided. Access to services provided for patient through BCCCP program.   Colorectal Cancer Screening: Per patient has never had colonoscopy completed. FIT Test completed 04/04/2020 that was negative. No complaints today.    Breast and Cervical Cancer Risk Assessment: Patient does not have family history of breast cancer, known genetic mutations, or radiation treatment to the chest before age 70. Patient does not have history of cervical dysplasia, immunocompromised, or DES exposure in-utero.  Risk Assessment    Risk Scores      05/31/2020   Last edited by: Narda Rutherford, LPN    5-year risk: 1 %   Lifetime risk: 6.5 %          A: BCCCP exam with pap smear Patient referred to [facility] by the [facility] due to recommending additional imaging of bilateral breasts. Screening mammogram completed 03/28/2020.  P: Referred patient to the [facility] of [facility] for a diagnostic mammogram per recommendation. Appointment scheduled Tuesday, May 31, 2020 at 1340.  Priscille Heidelberg, RN 05/31/2020 12:13 PM

## 2020-06-02 LAB — CYTOLOGY - PAP
Comment: NEGATIVE
Diagnosis: NEGATIVE
High risk HPV: NEGATIVE

## 2020-06-07 ENCOUNTER — Ambulatory Visit: Payer: Self-pay | Admitting: Critical Care Medicine

## 2020-06-08 ENCOUNTER — Telehealth: Payer: Self-pay

## 2020-06-08 NOTE — Telephone Encounter (Signed)
Normal Pap/HPV result letter mailed.  

## 2020-07-28 ENCOUNTER — Ambulatory Visit: Payer: No Typology Code available for payment source | Admitting: Critical Care Medicine

## 2020-08-18 ENCOUNTER — Ambulatory Visit: Payer: Self-pay | Attending: Critical Care Medicine | Admitting: Critical Care Medicine

## 2020-08-18 ENCOUNTER — Encounter: Payer: Self-pay | Admitting: Critical Care Medicine

## 2020-08-18 ENCOUNTER — Other Ambulatory Visit: Payer: Self-pay

## 2020-08-18 DIAGNOSIS — N6009 Solitary cyst of unspecified breast: Secondary | ICD-10-CM

## 2020-08-18 DIAGNOSIS — F0781 Postconcussional syndrome: Secondary | ICD-10-CM

## 2020-08-18 DIAGNOSIS — I10 Essential (primary) hypertension: Secondary | ICD-10-CM

## 2020-08-18 NOTE — Assessment & Plan Note (Signed)
Simple breast cyst without malignancy  Follow-up mammogram screening study in 1 year

## 2020-08-18 NOTE — Progress Notes (Addendum)
Subjective:    Patient ID: Laura Schultz, female    DOB: Jan 23, 1966, 54 y.o.   MRN: [id number] Virtual Visit via Telephone Note  I connected with Laura Schultz on 10/04/20 at  9:00 AM EST by telephone and verified that I am speaking with the correct person using two identifiers.   Consent:  I discussed the limitations, risks, security and privacy concerns of performing an evaluation and management service by telephone and the availability of in person appointments. I also discussed with the patient that there may be a patient responsible charge related to this service. The patient expressed understanding and agreed to proceed.  Location of patient: Patient is at home  Location of provider: I am in my office  Persons participating in the televisit with the patient.   No one else on the call    History of Present Illness: 03/21/20 54 y.o.F here for post hosp f/u Patient was in the urgent care July 15 with complaints of malaise and fatigue.  Thyroid function complete blood count and metabolic panel were all normal and urinalysis was normal EKG also was normal  The patient noted that she had previously had a fall sliding and indoor restaurant on a wet floor and hitting her head face and shoulder.  She has been to the urgent care couple times since that initial visit.  She had a CT scan of her head neck and face without significant fracture seen.  Patient states she still having some pain in the top of her head still having left facial discomfort.  The patient is here to establish for primary care at this time.  She has no known history of hypertension but comes in today with blood pressure 154/81 saturations 100% room air  05/24/2020 Patient returns in follow-up and notes history of trauma to the head after a fall this summer.  She still having headaches when she looks up with her vision.  Patient also has hypertension and has not been treated and today comes in at 152/70.  Note also  she had a screening mammogram which showed lesions in both breasts and she has an ultrasound of the axillas and diagnostic mammogram with potential biopsy upcoming in a week.  Her fecal occult studies for stool were negative.  HIV hep C were negative.  She does not have any insurance.  She appears to have a postconcussive type syndrome and we have been reluctant to order MRI on this patient so she can achieve financial assistance.  She does need a Pap smear at this time. Note TSH previously been checked was normal with prior history of multinodular goiter  08/18/2020 Patient returns today for follow-up of postconcussive syndrome with chronic headaches and hypertension.  This is a telephone visit.  The patient does not need an interpreter.  The patient had a traumatic fall hitting her head earlier this year with a negative CT of the neck and head.  We started her on metoprolol 25 mg daily for elevated blood pressure readings.  She does not have a blood pressure meter at home to check her blood pressure.  She states her headaches are unchanged since being on the Tylenol and the metoprolol.  She does not have insurance is not have any ability to obtain an MRI at this point.    This patient has no other specific complaints at this time.    She had an evaluation of bilateral breast masses which were simple cyst on ultrasound did not require  biopsy or aspiration.  She does not need another mammogram for 1 year.  She also had a Pap smear performed and this showed no malignancy.     Past Medical History:  Diagnosis Date  . Hypertension      Family History  Problem Relation Age of Onset  . Hypertension Mother   . Healthy Father      Social History   Socioeconomic History  . Marital status: Married    Spouse name: Not on file  . Number of children: 5  . Years of education: Not on file  . Highest education level: 12th grade  Occupational History  . Not on file  Tobacco Use  . Smoking status:  Never Smoker  . Smokeless tobacco: Never Used  Vaping Use  . Vaping Use: Never used  Substance and Sexual Activity  . Alcohol use: Not Currently  . Drug use: Never  . Sexual activity: Yes    Birth control/protection: Post-menopausal  Other Topics Concern  . Not on file  Social History Narrative  . Not on file   Social Determinants of Health   Financial Resource Strain: Not on file  Food Insecurity: Not on file  Transportation Needs: No Transportation Needs  . Lack of Transportation (Medical): No  . Lack of Transportation (Non-Medical): No  Physical Activity: Not on file  Stress: Not on file  Social Connections: Not on file  Intimate Partner Violence: Not on file     Allergies  Allergen Reactions  . Amoxicillin Swelling     Outpatient Medications Prior to Visit  Medication Sig Dispense Refill  . ibuprofen (ADVIL) 200 MG tablet Take 200 mg by mouth every 6 (six) hours as needed.    . metoprolol succinate (TOPROL-XL) 25 MG 24 hr tablet Take 1 tablet (25 mg total) by mouth daily. 90 tablet 3  . acetaminophen (TYLENOL) 500 MG tablet Take 1 tablet (500 mg total) by mouth in the morning, at noon, and at bedtime. (Patient not taking: Reported on 08/18/2020) 90 tablet 1   No facility-administered medications prior to visit.      Review of Systems  Constitutional: Negative for activity change, fatigue and fever.  HENT: Negative for congestion, dental problem, drooling, ear discharge, ear pain, facial swelling, hearing loss, mouth sores, nosebleeds, postnasal drip and rhinorrhea.   Eyes: Negative.   Respiratory: Negative.   Cardiovascular: Negative.   Gastrointestinal: Negative.   Endocrine: Negative.   Genitourinary: Negative.   Musculoskeletal: Negative.   Neurological: Positive for headaches.  Hematological: Negative.   Psychiatric/Behavioral: Negative.        Objective:   Physical Exam There were no vitals filed for this visit. This is a phone visit no  exam      Assessment & Plan:  I personally reviewed all images and lab data in the [facility] system as well as any outside material available during this office visit and agree with the  radiology impressions.   Hypertension Hypertension under treatment with metoprolol low-dose  Patient instructed to obtain a home blood pressure meter and bring with her the readings from this at the next visit which we made end of January  Maintain metoprolol  Postconcussive syndrome Postconcussive syndrome with chronic headaches  Plan referral to neurology  Breast cyst, bilateral breasts- benign Simple breast cyst without malignancy  Follow-up mammogram screening study in 1 year   Laura Schultz was seen today for headache.  Diagnoses and all orders for this visit:  Postconcussive syndrome -  Ambulatory referral to Neurology  Primary hypertension  Breast cyst, unspecified laterality     Follow Up Instructions:   Patient knows a follow-up exam we made end of January face-to-face and we will make a referral to neurology for the postconcussive headaches I discussed the assessment and treatment plan with the patient. The patient was provided an opportunity to ask questions and all were answered. The patient agreed with the plan and demonstrated an understanding of the instructions.   The patient was advised to call back or seek an in-person evaluation if the symptoms worsen or if the condition fails to improve as anticipated.  I provided 30 minutes of non-face-to-face time during this encounter  including  median intraservice time , review of notes, labs, imaging, medications  and explaining diagnosis and management to the patient .    Shan Levans, MD

## 2020-08-18 NOTE — Assessment & Plan Note (Signed)
Hypertension under treatment with metoprolol low-dose  Patient instructed to obtain a home blood pressure meter and bring with her the readings from this at the next visit which we made end of January  Maintain metoprolol

## 2020-08-18 NOTE — Assessment & Plan Note (Signed)
Postconcussive syndrome with chronic headaches  Plan referral to neurology

## 2020-08-18 NOTE — Progress Notes (Signed)
Pt has been having headaches 

## 2020-08-22 ENCOUNTER — Encounter: Payer: Self-pay | Admitting: Neurology

## 2020-10-04 ENCOUNTER — Ambulatory Visit: Payer: No Typology Code available for payment source | Admitting: Critical Care Medicine

## 2020-10-04 NOTE — Progress Notes (Deleted)
Subjective:    Patient ID: Laura Schultz, female    DOB: [birth year], 55 y.o.   MRN: [id number] Virtual Visit via Telephone Note  I connected with Laura Schultz on 10/04/20 at  1:30 PM EST by telephone and verified that I am speaking with the correct person using two identifiers.   Consent:  I discussed the limitations, risks, security and privacy concerns of performing an evaluation and management service by telephone and the availability of in person appointments. I also discussed with the patient that there may be a patient responsible charge related to this service. The patient expressed understanding and agreed to proceed.  Location of patient: Patient is at home  Location of provider: I am in my office  Persons participating in the televisit with the patient.   No one else on the call    History of Present Illness: 03/21/20 54 y.o.F here for post hosp f/u Patient was in the urgent care July 15 with complaints of malaise and fatigue.  Thyroid function complete blood count and metabolic panel were all normal and urinalysis was normal EKG also was normal  The patient noted that she had previously had a fall sliding and indoor restaurant on a wet floor and hitting her head face and shoulder.  She has been to the urgent care couple times since that initial visit.  She had a CT scan of her head neck and face without significant fracture seen.  Patient states she still having some pain in the top of her head still having left facial discomfort.  The patient is here to establish for primary care at this time.  She has no known history of hypertension but comes in today with blood pressure 154/81 saturations 100% room air  05/24/2020 Patient returns in follow-up and notes history of trauma to the head after a fall this summer.  She still having headaches when she looks up with her vision.  Patient also has hypertension and has not been treated and today comes in at 152/70.  Note also  she had a screening mammogram which showed lesions in both breasts and she has an ultrasound of the axillas and diagnostic mammogram with potential biopsy upcoming in a week.  Her fecal occult studies for stool were negative.  HIV hep C were negative.  She does not have any insurance.  She appears to have a postconcussive type syndrome and we have been reluctant to order MRI on this patient so she can achieve financial assistance.  She does need a Pap smear at this time. Note TSH previously been checked was normal with prior history of multinodular goiter  08/18/2020 Patient returns today for follow-up of postconcussive syndrome with chronic headaches and hypertension.  This is a telephone visit.  The patient does not need an interpreter.  The patient had a traumatic fall hitting her head earlier this year with a negative CT of the neck and head.  We started her on metoprolol 25 mg daily for elevated blood pressure readings.  She does not have a blood pressure meter at home to check her blood pressure.  She states her headaches are unchanged since being on the Tylenol and the metoprolol.  She does not have insurance is not have any ability to obtain an MRI at this point.    This patient has no other specific complaints at this time.    She had an evaluation of bilateral breast masses which were simple cyst on ultrasound did not require  biopsy or aspiration.  She does not need another mammogram for 1 year.  She also had a Pap smear performed and this showed no malignancy.    10/04/2020  Hypertension Hypertension under treatment with metoprolol low-dose  Patient instructed to obtain a home blood pressure meter and bring with her the readings from this at the next visit which we made end of January  Maintain metoprolol  Postconcussive syndrome Postconcussive syndrome with chronic headaches  Plan referral to neurology  Breast cyst, bilateral breasts- benign Simple breast cyst without  malignancy  Follow-up mammogram screening study in 1 year    Past Medical History:  Diagnosis Date  . Hypertension      Family History  Problem Relation Age of Onset  . Hypertension Mother   . Healthy Father      Social History   Socioeconomic History  . Marital status: Married    Spouse name: Not on file  . Number of children: 5  . Years of education: Not on file  . Highest education level: 12th grade  Occupational History  . Not on file  Tobacco Use  . Smoking status: Never Smoker  . Smokeless tobacco: Never Used  Vaping Use  . Vaping Use: Never used  Substance and Sexual Activity  . Alcohol use: Not Currently  . Drug use: Never  . Sexual activity: Yes    Birth control/protection: Post-menopausal  Other Topics Concern  . Not on file  Social History Narrative  . Not on file   Social Determinants of Health   Financial Resource Strain: Not on file  Food Insecurity: Not on file  Transportation Needs: No Transportation Needs  . Lack of Transportation (Medical): No  . Lack of Transportation (Non-Medical): No  Physical Activity: Not on file  Stress: Not on file  Social Connections: Not on file  Intimate Partner Violence: Not on file     Allergies  Allergen Reactions  . Amoxicillin Swelling     Outpatient Medications Prior to Visit  Medication Sig Dispense Refill  . acetaminophen (TYLENOL) 500 MG tablet Take 1 tablet (500 mg total) by mouth in the morning, at noon, and at bedtime. (Patient not taking: Reported on 08/18/2020) 90 tablet 1  . ibuprofen (ADVIL) 200 MG tablet Take 200 mg by mouth every 6 (six) hours as needed.    . metoprolol succinate (TOPROL-XL) 25 MG 24 hr tablet Take 1 tablet (25 mg total) by mouth daily. 90 tablet 3   No facility-administered medications prior to visit.      Review of Systems  Constitutional: Negative for activity change, fatigue and fever.  HENT: Negative for congestion, dental problem, drooling, ear discharge, ear  pain, facial swelling, hearing loss, mouth sores, nosebleeds, postnasal drip and rhinorrhea.   Eyes: Negative.   Respiratory: Negative.   Cardiovascular: Negative.   Gastrointestinal: Negative.   Endocrine: Negative.   Genitourinary: Negative.   Musculoskeletal: Negative.   Neurological: Positive for headaches.  Hematological: Negative.   Psychiatric/Behavioral: Negative.        Objective:   Physical Exam There were no vitals filed for this visit. This is a phone visit no exam      Assessment & Plan:  I personally reviewed all images and lab data in the [facility] system as well as any outside material available during this office visit and agree with the  radiology impressions.   No problem-specific Assessment & Plan notes found for this encounter.   There are no diagnoses linked to this encounter.  Follow Up Instructions:   Patient knows a follow-up exam we made end of January face-to-face and we will make a referral to neurology for the postconcussive headaches I discussed the assessment and treatment plan with the patient. The patient was provided an opportunity to ask questions and all were answered. The patient agreed with the plan and demonstrated an understanding of the instructions.   The patient was advised to call back or seek an in-person evaluation if the symptoms worsen or if the condition fails to improve as anticipated.  I provided 30 minutes of non-face-to-face time during this encounter  including  median intraservice time , review of notes, labs, imaging, medications  and explaining diagnosis and management to the patient .    Shan Levans, MD

## 2020-10-19 NOTE — Progress Notes (Signed)
NEUROLOGY CONSULTATION NOTE  Laura Schultz MRN: [id number] DOB: 07-Aug-1966  Referring provider: Shan Levans, MD Primary care provider: Shan Levans, MD  Reason for consult:  Postconcussion syndrome  Assessment/Plan:   Headache, unspecified.  Likely residual from her head injury.  Does not appear to be cervicogenic.  As it is positional, consider low-pressure headache, however I would suspect the headache would not just last several minutes.  Neurologic exam is unremarkable, symptoms stable since onset, and I do not suspect secondary intracranial abnormality, so another head image is not warranted.  I have no specific management: - may consider supplements:  Magnesium citrate, riboflavin, coQ10 - may consider acupuncture - as headaches are so brief, would not recommend a daily preventative but if becomes more frequent or prolonged, consider nortriptyline or gabapentin.  Otherwise, follow up as needed.   Subjective:  Laura Schultz is a 55 year old right-handed female with HTN who presents for postconcussion syndrome.  History supplemented by ED and referring provider's notes.  CT head and cervical spine from ED personally reviewed.  On 02/01/2020, she slipped and fell to the ground striking her head/left side of her face.  No loss of consciousness but she sustained a contusion on the left side of her face and shoulder and developed left sided head and facial pain.  She went to the ED where CT head/face showed no fractures or acute intracranial abnormality and C-spine showed no acute trauma.  Since then, she reports brief headache.  If she bends her head forward she will feel pressure around her eyes.  When she looks up from forward neck flexion, she will feel a mild ache across the forehead and around her eyes for about 10 minutes.  No neck pain, visual disturbance, dizziness, numbness, weakness, nausea, vomiting, photophobia and phonophobia.  Sometimes she may take an OTC  analgesic, maybe no more than 1 day a week.  Current medications:  Ibuprofen, Toprol-XL Past medications: acetaminophen,   PAST MEDICAL HISTORY: Past Medical History:  Diagnosis Date  . Hypertension     PAST SURGICAL HISTORY: Past Surgical History:  Procedure Laterality Date  . THYROID SURGERY      MEDICATIONS: Current Outpatient Medications on File Prior to Visit  Medication Sig Dispense Refill  . acetaminophen (TYLENOL) 500 MG tablet Take 1 tablet (500 mg total) by mouth in the morning, at noon, and at bedtime. (Patient not taking: Reported on 08/18/2020) 90 tablet 1  . ibuprofen (ADVIL) 200 MG tablet Take 200 mg by mouth every 6 (six) hours as needed.    . metoprolol succinate (TOPROL-XL) 25 MG 24 hr tablet Take 1 tablet (25 mg total) by mouth daily. 90 tablet 3   No current facility-administered medications on file prior to visit.    ALLERGIES: Allergies  Allergen Reactions  . Amoxicillin Swelling    FAMILY HISTORY: Family History  Problem Relation Age of Onset  . Hypertension Mother   . Healthy Father    Objective:  Blood pressure (!) 151/80, pulse 88, resp. rate 18, height 5\' 1"  (1.549 m), weight 166 lb (75.3 kg), SpO2 98 %. General: No acute distress.  Patient appears well-groomed.   Head:  Normocephalic/atraumatic Eyes:  fundi examined but not visualized Neck: supple, no paraspinal tenderness, full range of motion Back: No paraspinal tenderness Heart: regular rate and rhythm Lungs: Clear to auscultation bilaterally. Vascular: No carotid bruits. Neurological Exam: Mental status: alert and oriented to person, place, and time, recent and remote memory intact, fund of knowledge  intact, attention and concentration intact, speech fluent and not dysarthric, language intact. Cranial nerves: CN I: not tested CN II: pupils equal, round and reactive to light, visual fields intact CN III, IV, VI:  full range of motion, no nystagmus, no ptosis CN V: facial sensation  intact. CN VII: upper and lower face symmetric CN VIII: hearing intact CN IX, X: gag intact, uvula midline CN XI: sternocleidomastoid and trapezius muscles intact CN XII: tongue midline Bulk & Tone: normal, no fasciculations. Motor:  muscle strength 5/5 throughout Sensation:  Pinprick, temperature and vibratory sensation intact. Deep Tendon Reflexes:  2+ throughout,  toes downgoing.   Finger to nose testing:  Without dysmetria.   Heel to shin:  Without dysmetria.   Gait:  Normal station and stride.  Romberg negative.    Thank you for allowing me to take part in the care of this patient.  Shon Millet, DO  CC: Shan Levans, MD

## 2020-10-20 ENCOUNTER — Encounter: Payer: Self-pay | Admitting: Neurology

## 2020-10-20 ENCOUNTER — Ambulatory Visit (INDEPENDENT_AMBULATORY_CARE_PROVIDER_SITE_OTHER): Payer: 59 | Admitting: Neurology

## 2020-10-20 ENCOUNTER — Other Ambulatory Visit: Payer: Self-pay

## 2020-10-20 VITALS — BP 151/80 | HR 88 | Resp 18 | Ht 61.0 in | Wt 166.0 lb

## 2020-10-20 DIAGNOSIS — R519 Headache, unspecified: Secondary | ICD-10-CM

## 2020-10-20 NOTE — Patient Instructions (Signed)
I don't suspect anything in the head that is causing the headache.  It is likely just residual from your head injury.  Your neurologic exam is normal.   For management: - Consider magnesium citrate 400mg  daily, riboflavin 400mg  daily and coenzyme Q10 300mg  daily - Consider acupuncture -  Limit use of pain relievers to no more than 2 days out of week to prevent risk of rebound or medication-overuse headache.

## 2021-03-22 ENCOUNTER — Ambulatory Visit: Payer: No Typology Code available for payment source | Admitting: Physician Assistant

## 2021-03-22 ENCOUNTER — Ambulatory Visit: Payer: No Typology Code available for payment source | Admitting: Critical Care Medicine

## 2021-03-23 ENCOUNTER — Ambulatory Visit: Payer: No Typology Code available for payment source | Admitting: Critical Care Medicine

## 2021-03-23 ENCOUNTER — Ambulatory Visit (INDEPENDENT_AMBULATORY_CARE_PROVIDER_SITE_OTHER): Payer: 59 | Admitting: Physician Assistant

## 2021-03-23 ENCOUNTER — Other Ambulatory Visit: Payer: Self-pay

## 2021-03-23 VITALS — BP 159/78 | HR 73 | Temp 98.3°F | Resp 18 | Ht 61.0 in | Wt 158.0 lb

## 2021-03-23 DIAGNOSIS — M25471 Effusion, right ankle: Secondary | ICD-10-CM

## 2021-03-23 DIAGNOSIS — M5441 Lumbago with sciatica, right side: Secondary | ICD-10-CM

## 2021-03-23 DIAGNOSIS — M5442 Lumbago with sciatica, left side: Secondary | ICD-10-CM | POA: Diagnosis not present

## 2021-03-23 DIAGNOSIS — R937 Abnormal findings on diagnostic imaging of other parts of musculoskeletal system: Secondary | ICD-10-CM

## 2021-03-23 DIAGNOSIS — M25561 Pain in right knee: Secondary | ICD-10-CM

## 2021-03-23 DIAGNOSIS — M25562 Pain in left knee: Secondary | ICD-10-CM | POA: Diagnosis not present

## 2021-03-23 DIAGNOSIS — G8929 Other chronic pain: Secondary | ICD-10-CM

## 2021-03-23 DIAGNOSIS — M25472 Effusion, left ankle: Secondary | ICD-10-CM

## 2021-03-23 DIAGNOSIS — E559 Vitamin D deficiency, unspecified: Secondary | ICD-10-CM

## 2021-03-23 DIAGNOSIS — Z6829 Body mass index (BMI) 29.0-29.9, adult: Secondary | ICD-10-CM

## 2021-03-23 DIAGNOSIS — Z862 Personal history of diseases of the blood and blood-forming organs and certain disorders involving the immune mechanism: Secondary | ICD-10-CM

## 2021-03-23 DIAGNOSIS — I10 Essential (primary) hypertension: Secondary | ICD-10-CM

## 2021-03-23 MED ORDER — IBUPROFEN 600 MG PO TABS
600.0000 mg | ORAL_TABLET | Freq: Three times a day (TID) | ORAL | 0 refills | Status: AC | PRN
  Filled 2021-03-23: qty 30, 10d supply, fill #0

## 2021-03-23 NOTE — Patient Instructions (Signed)
We will call you with today's lab results.  We will call you with the results of your x-rays as soon as they are available.  I encourage you to continue using the pain creams that you have purchased over-the-counter, increase your water intake and you can try the ibuprofen that I sent to your pharmacy to help with your pain.  Roney Jaffe, PA-C Physician Assistant [facility] [email or web address]   Arthritis Arthritis is a term that is commonly used to refer to joint pain or jointdisease. There are more than 100 types of arthritis. What are the causes? The most common cause of this condition is wear and tear of a joint. Other causes include: Gout. Inflammation of a joint. An infection of a joint. Sprains and other injuries near the joint. A reaction to medicines or drugs, or an allergic reaction. In some cases, the cause may not be known. What are the signs or symptoms? The main symptom of this condition is pain in the joint during movement. Other symptoms include: Redness, swelling, or stiffness at a joint. Warmth coming from the joint. Fever. Overall feeling of illness. How is this diagnosed? This condition may be diagnosed with a physical exam and tests, including: Blood tests. Urine tests. Imaging tests, such as X-rays, an MRI, or a CT scan. Sometimes, fluid is removed from a joint for testing. How is this treated? This condition may be treated with: Treatment of the cause, if it is known. Rest. Raising (elevating) the joint. Applying cold or hot packs to the joint. Medicines to improve symptoms and reduce inflammation. Injections of a steroid such as cortisone into the joint to help reduce pain and inflammation. Depending on the cause of your arthritis, you may need to make lifestyle changes to reduce stress on your joint. Changes may include: Exercising more. Losing weight. Follow these instructions at  home: Medicines Take over-the-counter and prescription medicines only as told by your health care provider. Do not take aspirin to relieve pain if your health care provider thinks that gout may be causing your pain. Activity Rest your joint if told by your health care provider. Rest is important when your disease is active and your joint feels painful, swollen, or stiff. Avoid activities that make the pain worse. It is important to balance activity with rest. Exercise your joint regularly with range-of-motion exercises as told by your health care provider. Try doing low-impact exercise, such as: Swimming. Water aerobics. Biking. Walking. Managing pain, stiffness, and swelling     If directed, put ice on the joint. Put ice in a plastic bag. Place a towel between your skin and the bag. Leave the ice on for 20 minutes, 2-3 times per day. If your joint is swollen, raise (elevate) it above the level of your heart if directed by your health care provider. If your joint feels stiff in the morning, try taking a warm shower. If directed, apply heat to the affected area as often as told by your health care provider. Use the heat source that your health care provider recommends, such as a moist heat pack or a heating pad. If you have diabetes, do not apply heat without permission from your health care provider. To apply heat: Place a towel between your skin and the heat source. Leave the heat on for 20-30 minutes. Remove the heat if your skin turns bright red. This is especially important if you are unable to feel pain, heat, or cold. You may have a greater risk  of getting burned. General instructions Do not use any products that contain nicotine or tobacco, such as cigarettes, e-cigarettes, and chewing tobacco. If you need help quitting, ask your health care provider. Keep all follow-up visits as told by your health care provider. This is important. Contact a health care provider if: The pain  gets worse. You have a fever. Get help right away if: You develop severe joint pain, swelling, or redness. Many joints become painful and swollen. You develop severe back pain. You develop severe weakness in your leg. You cannot control your bladder or bowels. Summary Arthritis is a term that is commonly used to refer to joint pain or joint disease. There are more than 100 types of arthritis. The most common cause of this condition is wear and tear of a joint. Other causes include gout, inflammation or infection of the joint, sprains, or allergies. Symptoms of this condition include redness, swelling, or stiffness of the joint. Other symptoms include warmth, fever, or feeling ill. This condition is treated with rest, elevation, medicines, and applying cold or hot packs. Follow your health care provider's instructions about medicines, activity, exercises, and other home care treatments. This information is not intended to replace advice given to you by your health care provider. Make sure you discuss any questions you have with your healthcare provider. Document Revised: 07/21/2018 Document Reviewed: 07/21/2018 Elsevier Patient Education  2022 [employer].

## 2021-03-23 NOTE — Progress Notes (Signed)
Established Patient Office Visit  Subjective:  Patient ID: Laura Schultz, female    DOB: 09-Feb-1966  Age: 55 y.o. MRN: [id number]  CC:  Chief Complaint  Patient presents with   Back Pain   Knee Pain    bilateral    HPI Laura Schultz reports that she has been having pain in her lower back and in both knees for the past 2 years.  Reports that she started having pain radiating down her back through both legs for the past 2 months.  Reports that she also has been having some swelling in her ankles, mostly in the evening, for the past 2 months.  Denies numbness or tingling.  Reports that she has been using icy hot Tylenol with some relief.  Due to language barrier, an interpreter was present during the history-taking and subsequent discussion (and for part of the physical exam) with this patient.   Past Medical History:  Diagnosis Date   Hypertension     Past Surgical History:  Procedure Laterality Date   THYROID SURGERY      Family History  Problem Relation Age of Onset   Hypertension Mother    Healthy Father     Social History   Socioeconomic History   Marital status: Married    Spouse name: Not on file   Number of children: 5   Years of education: Not on file   Highest education level: 12th grade  Occupational History   Not on file  Tobacco Use   Smoking status: Never   Smokeless tobacco: Never  Vaping Use   Vaping Use: Never used  Substance and Sexual Activity   Alcohol use: Not Currently   Drug use: Never   Sexual activity: Yes    Birth control/protection: Post-menopausal  Other Topics Concern   Not on file  Social History Narrative   Right handed   One story home   Drinks tea   Social Determinants of Health   Financial Resource Strain: Not on file  Food Insecurity: Not on file  Transportation Needs: No Transportation Needs   Lack of Transportation (Medical): No   Lack of Transportation (Non-Medical): No  Physical Activity: Not on file   Stress: Not on file  Social Connections: Not on file  Intimate Partner Violence: Not on file    Outpatient Medications Prior to Visit  Medication Sig Dispense Refill   acetaminophen (TYLENOL) 500 MG tablet Take 1 tablet (500 mg total) by mouth in the morning, at noon, and at bedtime. 90 tablet 1   metoprolol succinate (TOPROL-XL) 25 MG 24 hr tablet Take 1 tablet (25 mg total) by mouth daily. 90 tablet 3   ibuprofen (ADVIL) 200 MG tablet Take 200 mg by mouth every 6 (six) hours as needed.     No facility-administered medications prior to visit.    Allergies  Allergen Reactions   Amoxicillin Swelling    ROS Review of Systems  Constitutional: Negative.   HENT: Negative.    Eyes: Negative.   Respiratory:  Negative for shortness of breath.   Cardiovascular:  Negative for chest pain.  Gastrointestinal: Negative.   Endocrine: Negative.   Genitourinary: Negative.   Musculoskeletal:  Positive for arthralgias.  Skin: Negative.   Allergic/Immunologic: Negative.   Neurological: Negative.   Hematological: Negative.   Psychiatric/Behavioral: Negative.       Objective:    Physical Exam Vitals and nursing note reviewed.  Constitutional:      Appearance: Normal appearance.  Cardiovascular:  Rate and Rhythm: Normal rate and regular rhythm.     Pulses: Normal pulses.     Heart sounds: Normal heart sounds.  Pulmonary:     Effort: Pulmonary effort is normal.     Breath sounds: Normal breath sounds.  Musculoskeletal:     Cervical back: Normal, normal range of motion and neck supple.     Thoracic back: Normal.     Lumbar back: Tenderness present.     Right knee: Swelling and crepitus present. Decreased range of motion. No tenderness.     Left knee: Swelling and crepitus present. Decreased range of motion. No tenderness.     Right ankle: Normal. No swelling.     Left ankle: No swelling.     Comments: Slight swelling noted bilaterally knees  Skin:    General: Skin is warm and  dry.  Neurological:     General: No focal deficit present.     Mental Status: She is alert and oriented to person, place, and time.  Psychiatric:        Mood and Affect: Mood normal.        Behavior: Behavior normal.        Thought Content: Thought content normal.        Judgment: Judgment normal.    BP (!) 159/78 (BP Location: Right Arm, Patient Position: Sitting, Cuff Size: Normal)   Pulse 73   Temp 98.3 F (36.8 C) (Temporal)   Resp 18   Ht 5' 1"  (1.549 m)   Wt 158 lb (71.7 kg)   SpO2 97%   BMI 29.85 kg/m  Wt Readings from Last 3 Encounters:  03/23/21 158 lb (71.7 kg)  10/20/20 166 lb (75.3 kg)  05/31/20 164 lb 6.4 oz (74.6 kg)     Health Maintenance Due  Topic Date Due   Pneumococcal Vaccine 39-28 Years old (1 - PCV) Never done   Zoster Vaccines- Shingrix (1 of 2) Never done    There are no preventive care reminders to display for this patient.  No results found for: TSH Lab Results  Component Value Date   WBC 5.4 03/23/2021   HGB 11.4 03/23/2021   HCT 35.4 03/23/2021   MCV 91 03/23/2021   PLT 247 03/23/2021   Lab Results  Component Value Date   NA 140 03/23/2021   K 3.9 03/23/2021   GLUCOSE 94 03/23/2021   BUN 15 03/23/2021   CREATININE 0.71 03/23/2021   BILITOT <0.2 03/23/2021   ALKPHOS 117 03/23/2021   AST 15 03/23/2021   PROT 7.0 03/23/2021   ALBUMIN 4.1 03/23/2021   CALCIUM 9.0 03/23/2021   EGFR 102 03/23/2021   No results found for: CHOL No results found for: HDL No results found for: LDLCALC No results found for: TRIG No results found for: CHOLHDL No results found for: HGBA1C    Assessment & Plan:   Problem List Items Addressed This Visit       Cardiovascular and Mediastinum   Hypertension   Relevant Orders   Comp. Metabolic Panel (12) (Completed)   Vitamin D, 25-hydroxy (Completed)     Nervous and Auditory   Acute bilateral low back pain with bilateral sciatica   Relevant Medications   ibuprofen (ADVIL) 600 MG tablet    Other Relevant Orders   DG Lumbar Spine Complete     Other   H/O iron deficiency anemia   Relevant Orders   CBC with Differential/Platelet (Completed)   Chronic pain of left knee - Primary  Relevant Medications   ibuprofen (ADVIL) 600 MG tablet   Other Relevant Orders   DG Knee 1-2 Views Left   Other Visit Diagnoses     Ankle edema, bilateral           Meds ordered this encounter  Medications   ibuprofen (ADVIL) 600 MG tablet    Sig: Take 1 tablet (600 mg total) by mouth every 8 (eight) hours as needed.    Dispense:  30 tablet    Refill:  0    Order Specific Question:   Supervising Provider    Answer:   Joya Gaskins, PATRICK E [[id number]]  1. Chronic pain of right knee Trial Tylenol.  Patient education given on supportive care, patient did state someone at home would be able to read the patient education to her.  Red flags given for prompt reevaluation. - DG Knee 1-2 Views Right; Future - ibuprofen (ADVIL) 600 MG tablet; Take 1 tablet (600 mg total) by mouth every 8 (eight) hours as needed.  Dispense: 30 tablet; Refill: 0  2. Chronic pain of left knee  - DG Knee 1-2 Views Left; Future - ibuprofen (ADVIL) 600 MG tablet; Take 1 tablet (600 mg total) by mouth every 8 (eight) hours as needed.  Dispense: 30 tablet; Refill: 0  3. Acute bilateral low back pain with bilateral sciatica  - ibuprofen (ADVIL) 600 MG tablet; Take 1 tablet (600 mg total) by mouth every 8 (eight) hours as needed.  Dispense: 30 tablet; Refill: 0 - DG Lumbar Spine Complete; Future  4.  Ankle edema Patient encouraged to increase hydration, lower sodium, keep feet elevated while resting  5. Primary hypertension Hypertension managed by primary care provider, health maintenance labs completed today - Comp. Metabolic Panel (12) - Vitamin D, 25-hydroxy  6. H/O iron deficiency anemia  - CBC with Differential/Platelet   I have reviewed the patient's medical history (PMH, PSH, Social History, Family History,  Medications, and allergies) , and have been updated if relevant. I spent 31 minutes reviewing chart and  face to face time with patient.    Follow-up: Return if symptoms worsen or fail to improve.    Loraine Grip Mayers, PA-C

## 2021-03-23 NOTE — Progress Notes (Signed)
Patient has not taken medication today. Patient has eaten today. Patient complains of bilateral knee pain with increased pain with walking and bending. Patient reports back pain radiating down to legs. Patient shares the pain feels like arthritis but has not been diagnosed. Patient reports BP results at home being 115-120/60-70. Patient shares the OV reading is always higher. Patient advised to write down daily readings to support her sharing and bringing them to the visit.

## 2021-03-24 ENCOUNTER — Ambulatory Visit (HOSPITAL_COMMUNITY)
Admission: RE | Admit: 2021-03-24 | Discharge: 2021-03-24 | Disposition: A | Discharge status: Home / Self Care | Payer: 59 | Source: Ambulatory Visit | Attending: Physician Assistant | Admitting: Physician Assistant

## 2021-03-24 ENCOUNTER — Other Ambulatory Visit: Payer: Self-pay

## 2021-03-24 ENCOUNTER — Encounter: Payer: Self-pay | Admitting: Physician Assistant

## 2021-03-24 DIAGNOSIS — M5441 Lumbago with sciatica, right side: Secondary | ICD-10-CM | POA: Diagnosis present

## 2021-03-24 DIAGNOSIS — M25562 Pain in left knee: Secondary | ICD-10-CM | POA: Diagnosis present

## 2021-03-24 DIAGNOSIS — G8929 Other chronic pain: Secondary | ICD-10-CM | POA: Insufficient documentation

## 2021-03-24 DIAGNOSIS — M25471 Effusion, right ankle: Secondary | ICD-10-CM | POA: Insufficient documentation

## 2021-03-24 DIAGNOSIS — M25561 Pain in right knee: Secondary | ICD-10-CM | POA: Insufficient documentation

## 2021-03-24 DIAGNOSIS — M5442 Lumbago with sciatica, left side: Secondary | ICD-10-CM | POA: Diagnosis present

## 2021-03-24 LAB — CBC WITH DIFFERENTIAL/PLATELET
Basophils Absolute: 0 10*3/uL (ref 0.0–0.2)
Basos: 0 %
EOS (ABSOLUTE): 0.3 10*3/uL (ref 0.0–0.4)
Eos: 5 %
Hematocrit: 35.4 % (ref 34.0–46.6)
Hemoglobin: 11.4 g/dL (ref 11.1–15.9)
Immature Grans (Abs): 0 10*3/uL (ref 0.0–0.1)
Immature Granulocytes: 0 %
Lymphocytes Absolute: 2.1 10*3/uL (ref 0.7–3.1)
Lymphs: 39 %
MCH: 29.2 pg (ref 26.6–33.0)
MCHC: 32.2 g/dL (ref 31.5–35.7)
MCV: 91 fL (ref 79–97)
Monocytes Absolute: 0.4 10*3/uL (ref 0.1–0.9)
Monocytes: 7 %
Neutrophils Absolute: 2.6 10*3/uL (ref 1.4–7.0)
Neutrophils: 49 %
Platelets: 247 10*3/uL (ref 150–450)
RBC: 3.91 x10E6/uL (ref 3.77–5.28)
RDW: 12.8 % (ref 11.7–15.4)
WBC: 5.4 10*3/uL (ref 3.4–10.8)

## 2021-03-24 LAB — COMP. METABOLIC PANEL (12)
AST: 15 IU/L (ref 0–40)
Albumin/Globulin Ratio: 1.4 (ref 1.2–2.2)
Albumin: 4.1 g/dL (ref 3.8–4.9)
Alkaline Phosphatase: 117 IU/L (ref 44–121)
BUN/Creatinine Ratio: 21 (ref 9–23)
BUN: 15 mg/dL (ref 6–24)
Bilirubin Total: <0.2 mg/dL (ref 0.0–1.2)
Calcium: 9 mg/dL (ref 8.7–10.2)
Chloride: 105 mmol/L (ref 96–106)
Creatinine, Ser: 0.71 mg/dL (ref 0.57–1.00)
Globulin, Total: 2.9 g/dL (ref 1.5–4.5)
Glucose: 94 mg/dL (ref 65–99)
Potassium: 3.9 mmol/L (ref 3.5–5.2)
Sodium: 140 mmol/L (ref 134–144)
Total Protein: 7 g/dL (ref 6.0–8.5)
eGFR: 102 mL/min/{1.73_m2} (ref >59)

## 2021-03-24 LAB — VITAMIN D 25 HYDROXY (VIT D DEFICIENCY, FRACTURES): Vit D, 25-Hydroxy: 12.2 ng/mL — ABNORMAL LOW (ref 30.0–100.0)

## 2021-03-24 MED ORDER — VITAMIN D (ERGOCALCIFEROL) 1.25 MG (50000 UNIT) PO CAPS
50000.0000 [IU] | ORAL_CAPSULE | ORAL | 2 refills | Status: AC
  Filled 2021-03-24 – 2021-05-16 (×2): qty 4, 28d supply, fill #0
  Filled 2021-05-16: qty 8, 56d supply, fill #0

## 2021-03-24 NOTE — Addendum Note (Signed)
Addended by: Roney Jaffe on: 03/24/2021 10:50 AM   Modules accepted: Orders

## 2021-03-27 NOTE — Addendum Note (Signed)
Addended by: Roney Jaffe on: 03/27/2021 04:32 PM   Modules accepted: Orders

## 2021-03-28 NOTE — Addendum Note (Signed)
Addended by: Roney Jaffe on: 03/28/2021 01:35 PM   Modules accepted: Orders

## 2021-03-29 ENCOUNTER — Telehealth: Payer: Self-pay | Admitting: *Deleted

## 2021-03-29 NOTE — Telephone Encounter (Signed)
-----   Message from Roney Jaffe, [place] sent at 03/28/2021  1:35 PM EDT ----- Please call patient and let her know that her left and right knee x-ray did not show any acute changes, they both did show however moderate to severe osteoarthritis.  I will refer to orthopedics for further evaluation

## 2021-03-29 NOTE — Telephone Encounter (Signed)
Medical Assistant used [employer] to contact patient.  Interpreter Name: [employer] Interpreter #: [id number] Patients daughter Laura Schultz verified DOB. Patient is aware of left and right knee showing no changes but moderate to severe osteoarthritis was noted and patient needs referral to ortho for further evaluation. Patient has an appointment with ortho on Friday and has picked up vitamin d to begin once a week. Aware of all other labs being normal.

## 2021-03-31 ENCOUNTER — Other Ambulatory Visit: Payer: Self-pay

## 2021-03-31 ENCOUNTER — Ambulatory Visit (INDEPENDENT_AMBULATORY_CARE_PROVIDER_SITE_OTHER): Payer: 59 | Admitting: Physician Assistant

## 2021-03-31 ENCOUNTER — Encounter: Payer: Self-pay | Admitting: Physician Assistant

## 2021-03-31 DIAGNOSIS — M1711 Unilateral primary osteoarthritis, right knee: Secondary | ICD-10-CM | POA: Diagnosis not present

## 2021-03-31 DIAGNOSIS — M1712 Unilateral primary osteoarthritis, left knee: Secondary | ICD-10-CM

## 2021-03-31 NOTE — Progress Notes (Signed)
Office Visit Note   Patient: Laura Schultz           Date of Birth: 21-May-1966           MRN: [id number] Visit Date: 03/31/2021              Requested by: Storm Frisk, MD [address] [place],  [place] [address] PCP: Storm Frisk, MD   Assessment & Plan: Visit Diagnoses:  1. Primary osteoarthritis of left knee   2. Primary osteoarthritis of right knee     Plan: Discussed with patient treatment options for her knee arthritis.  She would like to stay conservative in treatment.  Therefore we will start with quad strengthening exercises and knee friendly exercises.  Also I have her apply Voltaren gel 4 g up to 4 times daily to both knees.  She will follow-up with [place] on an as-needed basis pain persist or becomes worse.  Questions were encouraged and answered at length.  Follow-Up Instructions: Return if symptoms worsen or fail to improve.   Orders:  No orders of the defined types were placed in this encounter.  No orders of the defined types were placed in this encounter.     Procedures: No procedures performed   Clinical Data: No additional findings.   Subjective: Chief Complaint  Patient presents with   Right Knee - Pain   Left Knee - Pain    HPI Laura Schultz is a pleasant 55 year old female comes in today with bilateral knee pain.  She states the knee pains been ongoing for the past 2 years on and off.  Pains been worse over the last 2 months.  No known injury.  She is tried ibuprofen.  States that her left knee pain is worse than the right.  She has had no other treatment.  She is having no mechanical symptoms of either knee. Radiographs of both knees are reviewed on epic these are dated 03/27/2021.  Shows moderate to severe tricompartmental arthritis in both knees.  Bilateral knees medial compartment showed bone-on-bone contact.  Severe patellofemoral changes.  No acute fractures.  Knee is well located.   Review of Systems Denies any fevers chills or  recent vaccines.  Please see HPI otherwise negative.  Objective: Vital Signs: There were no vitals taken for this visit.  Physical Exam General Ortho Exam Bilateral knees no abnormal warmth erythema.  Significant patellofemoral crepitus with passive range of motion both knees.  No instability valgus varus stressing.  Full range of motion of both knees.  Patient ambulates with no assistive device. Specialty Comments:  No specialty comments available.  Imaging: No results found.   PMFS History: Patient Active Problem List   Diagnosis Date Noted   Chronic pain of left knee 03/24/2021   Acute bilateral low back pain with bilateral sciatica 03/24/2021   Ankle edema, bilateral 03/24/2021   Hypertension 05/24/2020   Breast cyst, bilateral breasts- benign 05/24/2020   Postconcussive syndrome 03/21/2020   H/O iron deficiency anemia 09/25/2013   Multinodular goiter 09/25/2013   Past Medical History:  Diagnosis Date   Hypertension     Family History  Problem Relation Age of Onset   Hypertension Mother    Healthy Father     Past Surgical History:  Procedure Laterality Date   THYROID SURGERY     Social History   Occupational History   Not on file  Tobacco Use   Smoking status: Never   Smokeless tobacco: Never  Vaping Use  Vaping Use: Never used  Substance and Sexual Activity   Alcohol use: Not Currently   Drug use: Never   Sexual activity: Yes    Birth control/protection: Post-menopausal

## 2021-05-04 ENCOUNTER — Other Ambulatory Visit: Payer: Self-pay

## 2021-05-05 ENCOUNTER — Other Ambulatory Visit: Payer: Self-pay

## 2021-05-16 ENCOUNTER — Other Ambulatory Visit: Payer: Self-pay

## 2023-01-22 ENCOUNTER — Emergency Department (HOSPITAL_BASED_OUTPATIENT_CLINIC_OR_DEPARTMENT_OTHER): Payer: BLUE CROSS/BLUE SHIELD

## 2023-01-22 ENCOUNTER — Other Ambulatory Visit: Payer: Self-pay

## 2023-01-22 ENCOUNTER — Emergency Department (HOSPITAL_BASED_OUTPATIENT_CLINIC_OR_DEPARTMENT_OTHER)
Admission: EM | Admit: 2023-01-22 | Discharge: 2023-01-22 | Disposition: A | Discharge status: Home / Self Care | Payer: BLUE CROSS/BLUE SHIELD | Attending: Emergency Medicine | Admitting: Emergency Medicine

## 2023-01-22 ENCOUNTER — Encounter (HOSPITAL_BASED_OUTPATIENT_CLINIC_OR_DEPARTMENT_OTHER): Payer: Self-pay | Admitting: Emergency Medicine

## 2023-01-22 ENCOUNTER — Other Ambulatory Visit (HOSPITAL_BASED_OUTPATIENT_CLINIC_OR_DEPARTMENT_OTHER): Payer: Self-pay

## 2023-01-22 DIAGNOSIS — R051 Acute cough: Secondary | ICD-10-CM

## 2023-01-22 DIAGNOSIS — Z79899 Other long term (current) drug therapy: Secondary | ICD-10-CM | POA: Diagnosis not present

## 2023-01-22 DIAGNOSIS — Z1152 Encounter for screening for COVID-19: Secondary | ICD-10-CM | POA: Insufficient documentation

## 2023-01-22 DIAGNOSIS — I10 Essential (primary) hypertension: Secondary | ICD-10-CM | POA: Diagnosis not present

## 2023-01-22 DIAGNOSIS — J470 Bronchiectasis with acute lower respiratory infection: Secondary | ICD-10-CM | POA: Diagnosis not present

## 2023-01-22 DIAGNOSIS — R0789 Other chest pain: Secondary | ICD-10-CM

## 2023-01-22 LAB — HEPATIC FUNCTION PANEL
ALT: 18 U/L (ref 0–44)
AST: 18 U/L (ref 15–41)
Albumin: 3.4 g/dL — ABNORMAL LOW (ref 3.5–5.0)
Alkaline Phosphatase: 87 U/L (ref 38–126)
Bilirubin, Direct: <0.1 mg/dL (ref 0.0–0.2)
Total Bilirubin: 0.5 mg/dL (ref 0.3–1.2)
Total Protein: 7.4 g/dL (ref 6.5–8.1)

## 2023-01-22 LAB — RESP PANEL BY RT-PCR (RSV, FLU A&B, COVID)  RVPGX2
Influenza A by PCR: NEGATIVE
Influenza B by PCR: NEGATIVE
Resp Syncytial Virus by PCR: NEGATIVE
SARS Coronavirus 2 by RT PCR: NEGATIVE

## 2023-01-22 LAB — CBC
HCT: 33.4 % — ABNORMAL LOW (ref 36.0–46.0)
Hemoglobin: 10.6 g/dL — ABNORMAL LOW (ref 12.0–15.0)
MCH: 29.5 pg (ref 26.0–34.0)
MCHC: 31.7 g/dL (ref 30.0–36.0)
MCV: 93 fL (ref 80.0–100.0)
Platelets: 352 10*3/uL (ref 150–400)
RBC: 3.59 MIL/uL — ABNORMAL LOW (ref 3.87–5.11)
RDW: 13.5 % (ref 11.5–15.5)
WBC: 5.8 10*3/uL (ref 4.0–10.5)
nRBC: 0 % (ref 0.0–0.2)

## 2023-01-22 LAB — BASIC METABOLIC PANEL
Anion gap: 9 (ref 5–15)
BUN: 14 mg/dL (ref 6–20)
CO2: 24 mmol/L (ref 22–32)
Calcium: 8.6 mg/dL — ABNORMAL LOW (ref 8.9–10.3)
Chloride: 102 mmol/L (ref 98–111)
Creatinine, Ser: 0.57 mg/dL (ref 0.44–1.00)
GFR, Estimated: >60 mL/min (ref >60)
Glucose, Bld: 96 mg/dL (ref 70–99)
Potassium: 3.8 mmol/L (ref 3.5–5.1)
Sodium: 135 mmol/L (ref 135–145)

## 2023-01-22 LAB — TROPONIN I (HIGH SENSITIVITY)
Troponin I (High Sensitivity): 8 ng/L (ref <18)
Troponin I (High Sensitivity): 8 ng/L (ref <18)

## 2023-01-22 MED ORDER — PREDNISONE 10 MG PO TABS
40.0000 mg | ORAL_TABLET | Freq: Every day | ORAL | 0 refills | Status: AC
  Filled 2023-01-22: qty 20, 5d supply, fill #0

## 2023-01-22 MED ORDER — IOHEXOL 350 MG/ML SOLN
100.0000 mL | Freq: Once | INTRAVENOUS | Status: AC | PRN
  Administered 2023-01-22: 100 mL via INTRAVENOUS

## 2023-01-22 MED ORDER — MOXIFLOXACIN HCL 400 MG PO TABS
400.0000 mg | ORAL_TABLET | Freq: Every day | ORAL | 0 refills | Status: AC
  Filled 2023-01-22: qty 5, 5d supply, fill #0

## 2023-01-22 MED ORDER — BENZONATATE 100 MG PO CAPS
100.0000 mg | ORAL_CAPSULE | Freq: Three times a day (TID) | ORAL | 0 refills | Status: AC
  Filled 2023-01-22: qty 21, 7d supply, fill #0

## 2023-01-22 NOTE — ED Provider Notes (Signed)
EMERGENCY DEPARTMENT AT [facility] Provider Note   CSN: [id number] Arrival date & time: 01/22/23  1255     History  Chief Complaint  Patient presents with   Chest Pain    Laura Schultz is a 57 y.o. female.   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  The patient denies any night sweats or weight loss.  She states that she has had no hemoptysis.  She has been producing clear to yellow mucus with her cough.  No known sick contacts.  She endorses pleuritic chest discomfort on the right, worse when taking a deep breath.  No history of DVT or PE.  No abdominal pain, nausea or vomiting.  She denies any lower extremity swelling.  Home Medications Prior to Admission medications   Medication Sig Start Date End Date Taking? Authorizing Provider  benzonatate (TESSALON) 100 MG capsule Take 1 capsule (100 mg total) by mouth every 8 (eight) hours. 01/22/23  Yes Ernie Avena, MD  moxifloxacin (AVELOX) 400 MG tablet Take 1 tablet (400 mg total) by mouth daily at 8 pm for 5 days. 01/22/23 01/27/23 Yes Ernie Avena, MD  predniSONE (DELTASONE) 10 MG tablet Take 4 tablets (40 mg total) by mouth daily for 5 days. 01/22/23 01/27/23 Yes Ernie Avena, MD  acetaminophen (TYLENOL) 500 MG tablet Take 1 tablet (500 mg total) by mouth in the morning, at noon, and at bedtime. 05/24/20   Storm Frisk, MD  ibuprofen (ADVIL) 600 MG tablet Take 1 tablet (600 mg total) by mouth every 8 (eight) hours as needed. 03/23/21   Mayers, Cari S, PA-C  metoprolol succinate (TOPROL-XL) 25 MG 24 hr tablet Take 1 tablet (25 mg total) by mouth daily. 05/24/20   Storm Frisk, MD  Vitamin D, Ergocalciferol, (DRISDOL)  1.25 MG (50000 UNIT) CAPS capsule Take 1 capsule (50,000 Units total) by mouth every 7 (seven) days. 03/24/21   Mayers, Cari S, PA-C      Allergies    Amoxicillin    Review of Systems   Review of Systems  Respiratory:  Positive for cough and shortness of breath.   Cardiovascular:  Positive for chest pain.  All other systems reviewed and are negative.   Physical Exam Updated Vital Signs BP 138/61   Pulse 67   Temp 97.8 F (36.6 C)   Resp 16   Ht 5\' 1"  (1.549 m)   Wt 64.9 kg   SpO2 97%   BMI 27.02 kg/m  Physical Exam Vitals and nursing note reviewed.  Constitutional:      General: She is not in acute distress.    Appearance: She is well-developed.  HENT:     Head: Normocephalic and atraumatic.  Eyes:     Conjunctiva/sclera: Conjunctivae normal.  Cardiovascular:     Rate and Rhythm: Normal rate and regular rhythm.     Heart sounds: No murmur heard. Pulmonary:     Effort: Pulmonary effort is normal. No respiratory distress.     Breath sounds: Normal breath sounds.  Abdominal:     Palpations: Abdomen is soft.     Tenderness: There is no abdominal tenderness.  Musculoskeletal:        General: No swelling.  Cervical back: Neck supple.     Right lower leg: No edema.     Left lower leg: No edema.  Skin:    General: Skin is warm and dry.     Capillary Refill: Capillary refill takes less than 2 seconds.  Neurological:     Mental Status: She is alert.  Psychiatric:        Mood and Affect: Mood normal.     ED Results / Procedures / Treatments   Labs (all labs ordered are listed, but only abnormal results are displayed) Labs Reviewed  BASIC METABOLIC PANEL - Abnormal; Notable for the following components:      Result Value   Calcium 8.6 (*)    All other components within normal limits  CBC - Abnormal; Notable for the following components:   RBC 3.59 (*)    Hemoglobin 10.6 (*)    HCT 33.4 (*)    All other components within normal limits  HEPATIC FUNCTION  PANEL - Abnormal; Notable for the following components:   Albumin 3.4 (*)    All other components within normal limits  RESP PANEL BY RT-PCR (RSV, FLU A&B, COVID)  [id number]  EXPECTORATED SPUTUM ASSESSMENT W GRAM STAIN, RFLX TO RESP C  TROPONIN I (HIGH SENSITIVITY)  TROPONIN I (HIGH SENSITIVITY)    EKG EKG Interpretation  Date/Time:  Tuesday Jan 22 2023 13:09:46 EDT Ventricular Rate:  79 PR Interval:  142 QRS Duration: 84 QT Interval:  384 QTC Calculation: 390 R Axis:   50 Text Interpretation: Sinus rhythm Atrial premature complexes agree, no ischemic appearance, no old comparison Confirmed by Arby Barrette [id number]) on 01/22/2023 2:36:16 PM  Radiology CT Angio Chest PE W and/or Wo Contrast  Result Date: 01/22/2023 CLINICAL DATA:  Chest pain and cough for a week EXAM: CT ANGIOGRAPHY CHEST WITH CONTRAST TECHNIQUE: Multidetector CT imaging of the chest was performed using the standard protocol during bolus administration of intravenous contrast. Multiplanar CT image reconstructions and MIPs were obtained to evaluate the vascular anatomy. RADIATION DOSE REDUCTION: This exam was performed according to the departmental dose-optimization program which includes automated exposure control, adjustment of the mA and/or kV according to patient size and/or use of iterative reconstruction technique. CONTRAST:  OMNIPAQUE IOHEXOL 350 MG/ML SOLN COMPARISON:  Chest x-ray earlier 01/14/2019 FINDINGS: Cardiovascular: Heart is nonenlarged. No pericardial effusion. Thoracic aorta has a normal course and caliber with some mild atherosclerotic plaque. There is a bovine type aortic arch, a normal variant. No segmental or larger pulmonary embolism identified. Mediastinum/Nodes: Small thyroid gland. Slightly patulous esophagus. No specific abnormal lymph node enlargement identified in the axillary regions, hilum or mediastinum. Prominent right hilar lymph node. Lungs/Pleura: Left lung has some linear areas of  opacity along the lingula and left lower lobe, favoring scar or atelectasis. No left-sided consolidation, pneumothorax or effusion. The right lung has a area of severe bronchiectasis along the medial aspect of the left lower lobe inferiorly with some volume loss. No separate consolidation, pneumothorax or effusion. Mild basilar scar or atelectasis. Small air cyst right lower lobe on series 5, image 67. Upper Abdomen: The extreme upper abdomen is preserved. The adrenal glands are incompletely included in the imaging field. Musculoskeletal: Mild scattered degenerative changes along the spine. Review of the MIP images confirms the above findings. IMPRESSION: No pulmonary embolism identified. Area of volume loss with severe focal bronchiectasis along the inferior aspect of the right upper lobe medially. Please correlate for any known history. Scattered other areas of mild scarring and  atelectatic changes of the lungs. Aortic Atherosclerosis (ICD10-I70.0). Electronically Signed   By: Karen Kays M.D.   On: 01/22/2023 16:16   DG Chest 2 View  Result Date: 01/22/2023 CLINICAL DATA:  Chest pain and cough for 1 week EXAM: CHEST - 2 VIEW COMPARISON:  None Available. FINDINGS: Normal heart size and vascularity. External artifact over the upper lobes bilaterally. Bandlike linear scarring from the right hilum extending along the elevated right minor fissure. Nonspecific right hilar and subcarinal fullness. No available comparison studies to document stability. No acute airspace process, significant collapse or consolidation. Negative for edema, effusion or pneumothorax. Trachea midline. No acute osseous finding. IMPRESSION: 1. Right perihilar scarring. 2. Nonspecific right hilar and subcarinal fullness. No available comparison studies to document stability. Consider chest CT for further evaluation. Electronically Signed   By: Judie Petit.  Shick M.D.   On: 01/22/2023 13:59    Procedures Procedures    Medications Ordered in  ED Medications  iohexol (OMNIPAQUE) 350 MG/ML injection 100 mL (100 mLs Intravenous Contrast Given 01/22/23 1546)    ED Course/ Medical Decision Making/ A&P                             Medical Decision Making Amount and/or Complexity of Data Reviewed Labs: ordered. Radiology: ordered.  Risk Prescription drug management.    58 year old female with medical history significant for hypertension, remote history of TB treated [facility] years ago at [facility], presenting to the emergency department with roughly 1 week of productive cough, pleuritic chest discomfort, low-grade fevers and chills.  The patient denies any night sweats or weight loss.  She states that she has had no hemoptysis.  She has been producing clear to yellow mucus with her cough.  No known sick contacts.  She endorses pleuritic chest discomfort on the right, worse when taking a deep breath.  No history of DVT or PE.  No abdominal pain, nausea or vomiting.  She denies any lower extremity swelling.  On arrival, the patient was afebrile, bradycardic heart rate 40, RR 20, BP 145/59, saturating 99% on room air.  Initial EKG revealed sinus rhythm, ventricular rate 79, no acute ischemic changes.  No prior EKGs for comparison.  Initial chest x-ray was performed revealed right perihilar scarring with nonspecific right hilar and subcarinal fullness seen on chest x-ray, CT imaging was recommended.  Differential diagnosis includes pneumonia, viral bronchitis, pulmonary embolism, Parenchymal mass/malignancy.  Initial laboratory evaluation significant for CBC without a leukocytosis, hemoglobin 10.6, initial troponin 8, BMP generally unremarkable. Repeat troponin 8. COVID and Flu testing collected and pending. Expectorated sputum w/gram stain ordered.  CTA PE study: IMPRESSION:  No pulmonary embolism identified.    Area of volume loss with severe focal bronchiectasis along the  inferior aspect of the right upper lobe medially. Please correlate   for any known history. Scattered other areas of mild scarring and  atelectatic changes of the lungs.    Aortic Atherosclerosis (ICD10-I70.0).    Suspect likely viral infection with underlying component of bronchiectasis noted on CT imaging.  Due to this we will treat with a course of moxifloxacin, steroid, Tessalon.  Advised to the patient follow-up outpatient with pulmonology regarding this finding on CT imaging.  Overall well-appearing, vitally stable, stable for discharge at this time.  Return precautions provided.   Final Clinical Impression(s) / ED Diagnoses Final diagnoses:  Acute cough  Bronchiectasis with acute lower respiratory infection (HCC)  Atypical chest pain  Rx / DC Orders ED Discharge Orders          Ordered    Ambulatory referral to Pulmonology       Comments: Bronchiectasis on CT   01/22/23 1628    moxifloxacin (AVELOX) 400 MG tablet  Daily        01/22/23 1632    predniSONE (DELTASONE) 10 MG tablet  Daily        01/22/23 1632    benzonatate (TESSALON) 100 MG capsule  Every 8 hours        01/22/23 1632              Ernie Avena, MD 01/22/23 1717

## 2023-01-22 NOTE — ED Notes (Signed)
Pt A&Ox4 ambulatory at d/c with independent steady gait. Pt verbalized understanding of d/c instructions, prescriptions and follow up care. Pt asked this RN to review d/c paperwork with her daughter on the phone and the daughter verbalized understanding.

## 2023-01-22 NOTE — Discharge Instructions (Addendum)
Your CT imaging was negative for blood clot in your lungs are clear evidence of pneumonia.  Your symptoms are likely due to a viral infection.  We will treat you with a course of steroids in addition to an antibiotic.  Your CT imaging did show evidence of bronchiectasis. Please follow-up with outpatient pulmonology regarding this finding. IMPRESSION:  No pulmonary embolism identified.    Area of volume loss with severe focal bronchiectasis along the  inferior aspect of the right upper lobe medially. Please correlate  for any known history. Scattered other areas of mild scarring and  atelectatic changes of the lungs.    Aortic Atherosclerosis (ICD10-I70.0).

## 2023-01-22 NOTE — ED Notes (Signed)
Pt unable to cough up anything for sputum culture.

## 2023-01-22 NOTE — ED Triage Notes (Signed)
Patient arrives ambulatory by POV c/o cough onset about 3 days ago. Started having right arm pain and chest pain. Clear to yellow mucus with cough.

## 2023-01-23 ENCOUNTER — Other Ambulatory Visit (HOSPITAL_BASED_OUTPATIENT_CLINIC_OR_DEPARTMENT_OTHER): Payer: Self-pay

## 2023-06-20 IMAGING — CR DG KNEE 1-2V*L*
2 series · 2 of 2 positions shown · non-contrast
Comparison: None.

CLINICAL DATA: Bilateral knee pain for several days

EXAM:
LEFT KNEE - 1-2 VIEW; RIGHT KNEE - 1-2 VIEW

[knee ap]
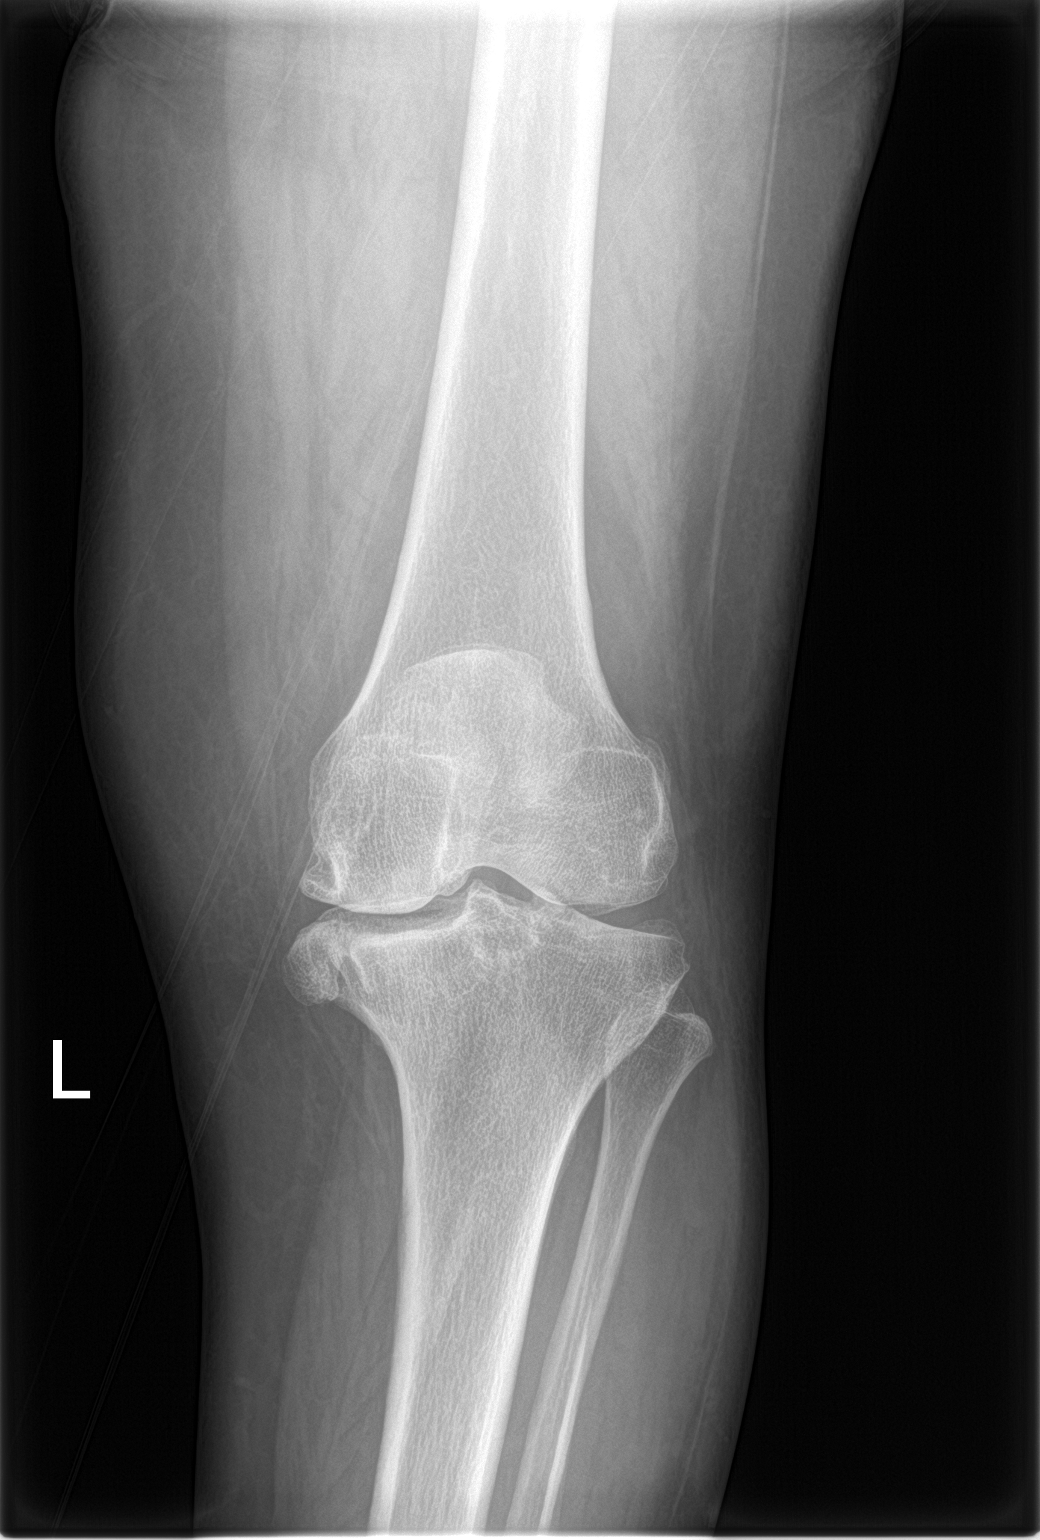

[knee lat]
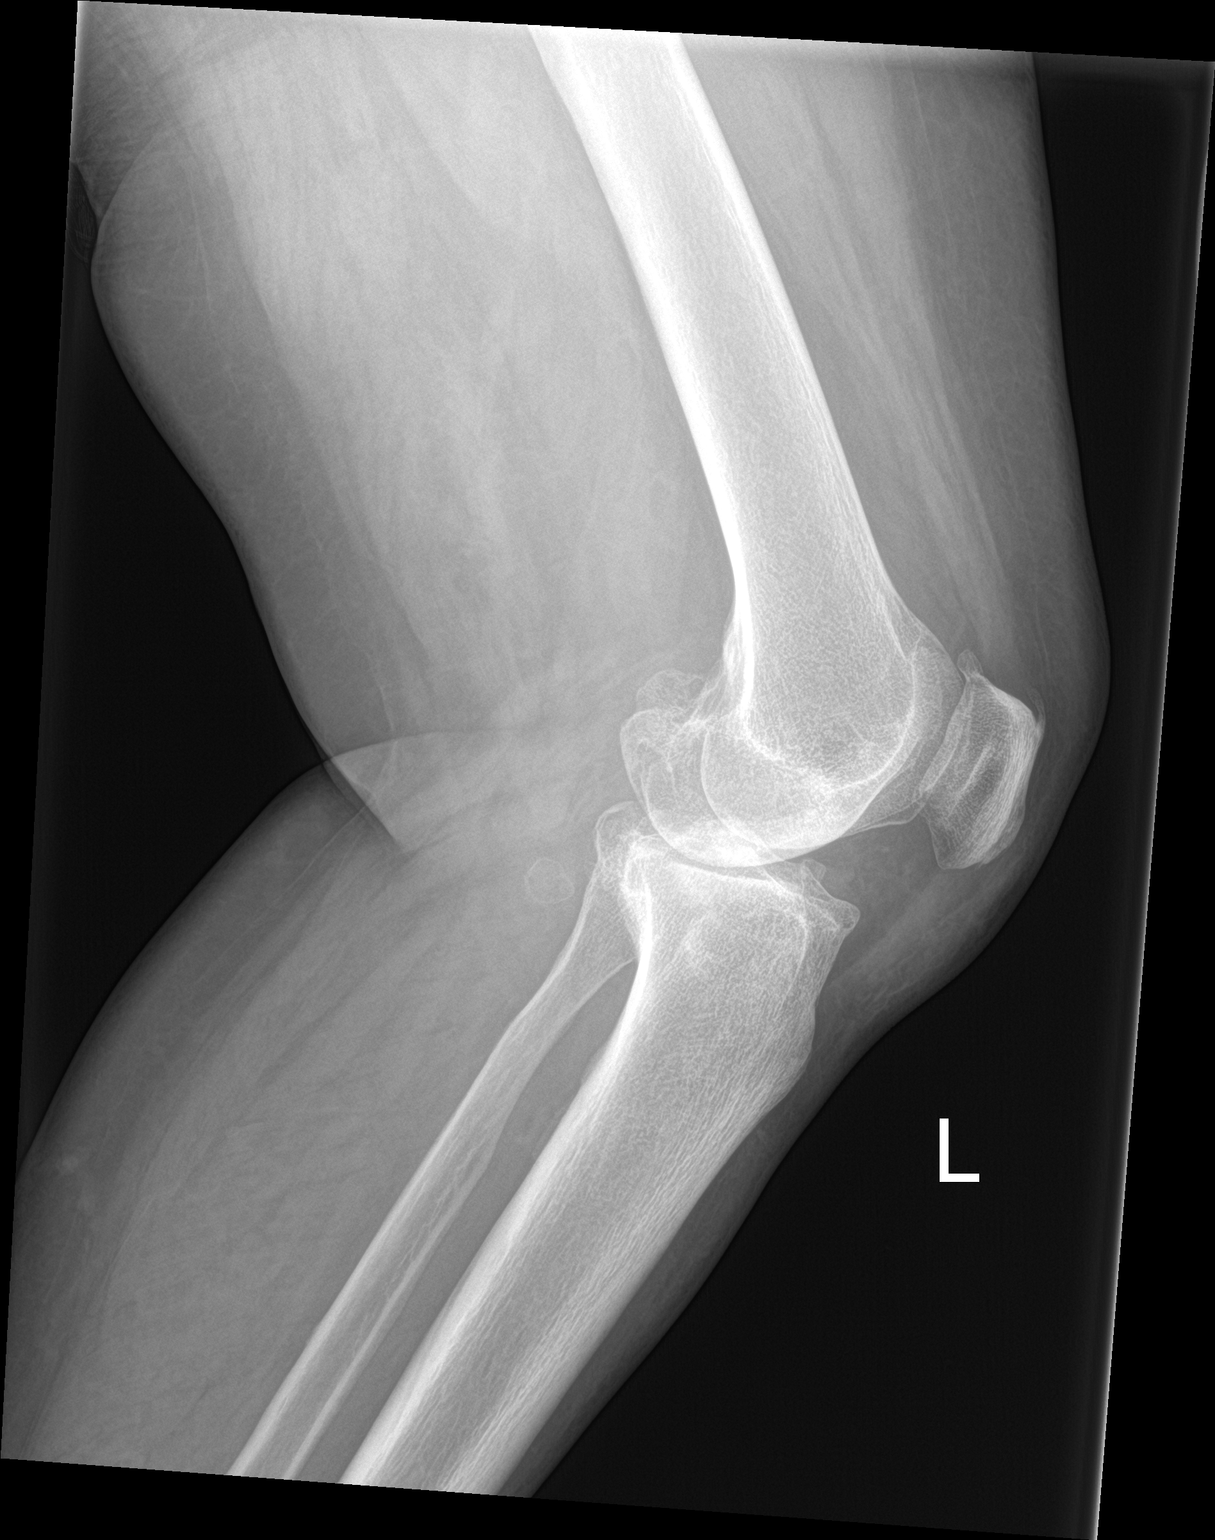

[2 of 2 positions shown; findings below may reference images not displayed]

FINDINGS: Right knee: Frontal and lateral views of the right knee demonstrate
moderate to severe 3 compartmental osteoarthritis, most pronounced
in the medial compartment. No fracture, subluxation, or dislocation.
No joint effusion.

Left knee: Frontal and lateral views of the left knee demonstrate
moderate to severe 3 compartmental osteoarthritis, greatest in the
medial compartment. No fracture, subluxation, or dislocation. Trace
joint effusion is likely reactive.
IMPRESSION: 1. Moderate to severe 3 compartmental osteoarthritis bilaterally,
greatest in the medial compartments.
2. Trace left knee effusion.

## 2023-06-20 IMAGING — CR DG LUMBAR SPINE COMPLETE 4+V
5 series · 5 of 5 positions shown · non-contrast
Comparison: None.

CLINICAL DATA: Low back pain for several days.  No recent trauma.

EXAM:
LUMBAR SPINE - COMPLETE 4+ VIEW

[l-spine ap]
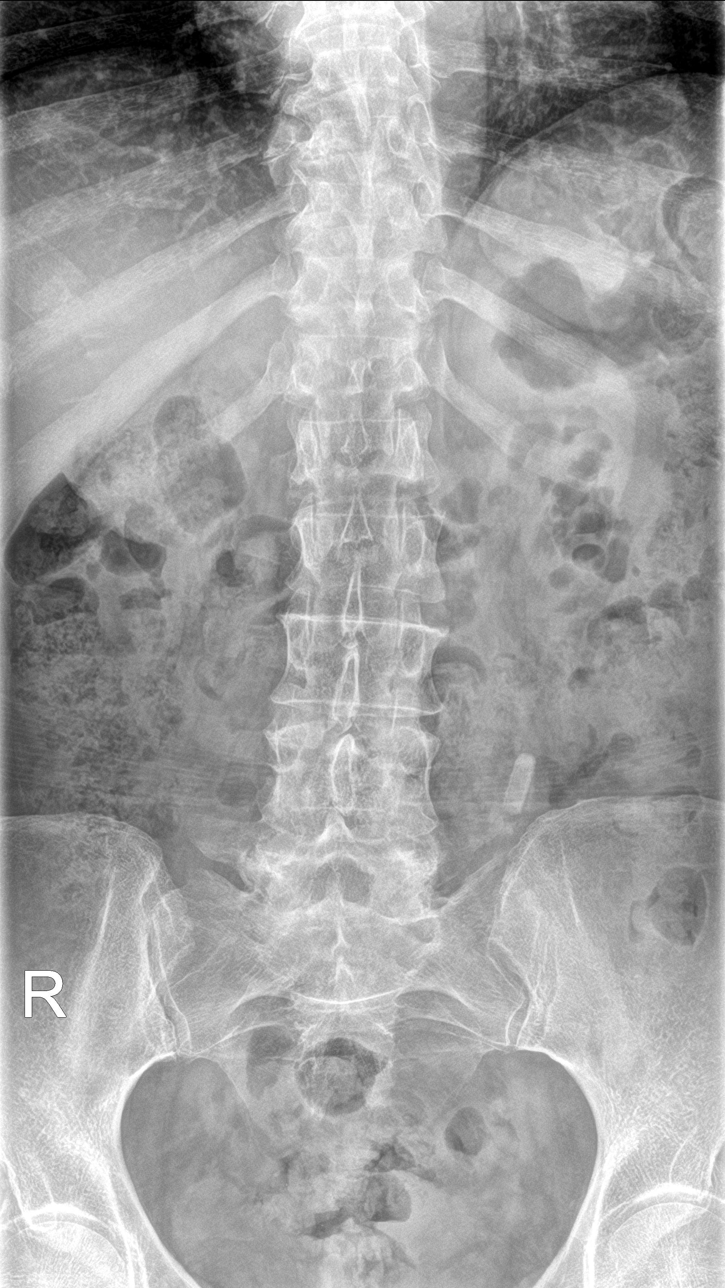

[l-spine obl (1 of 2)]
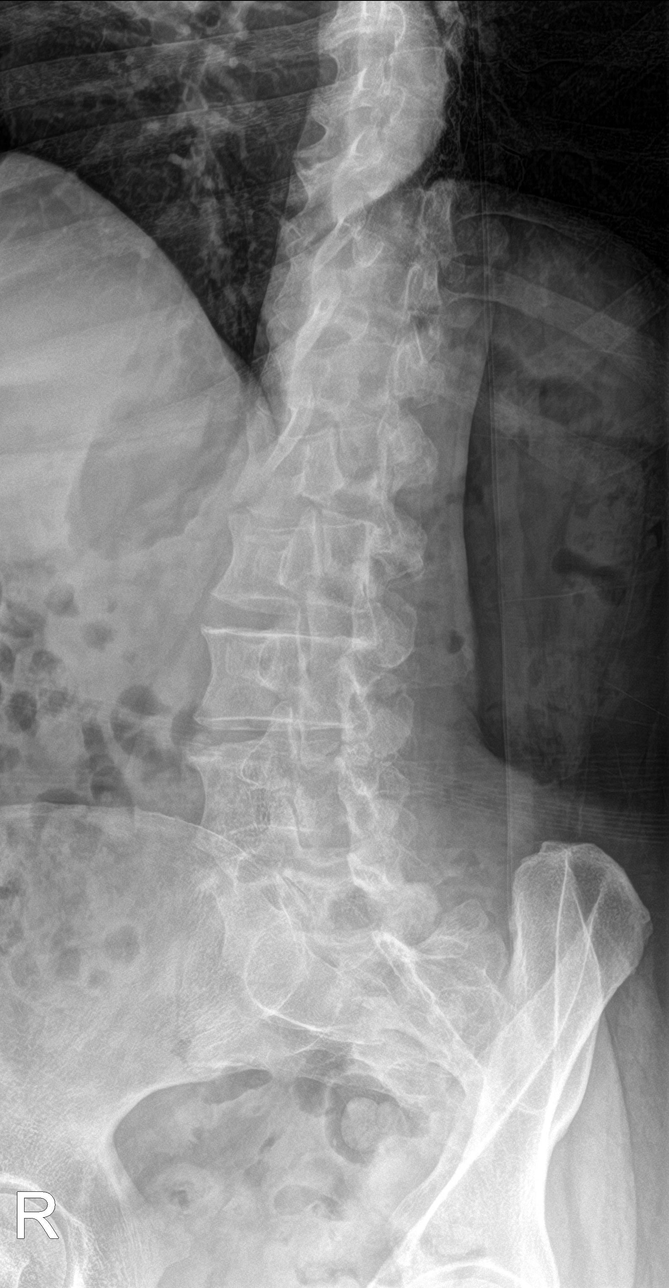

[l-spine obl (2 of 2)]
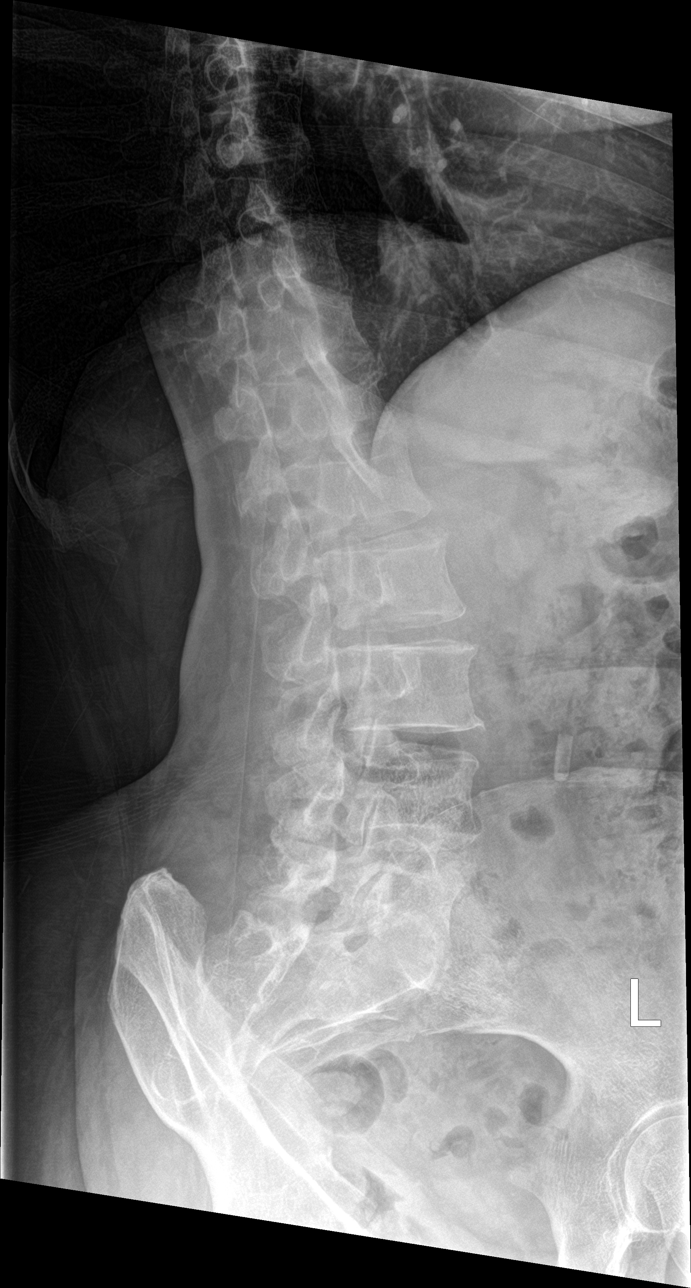

[l-spine lat]
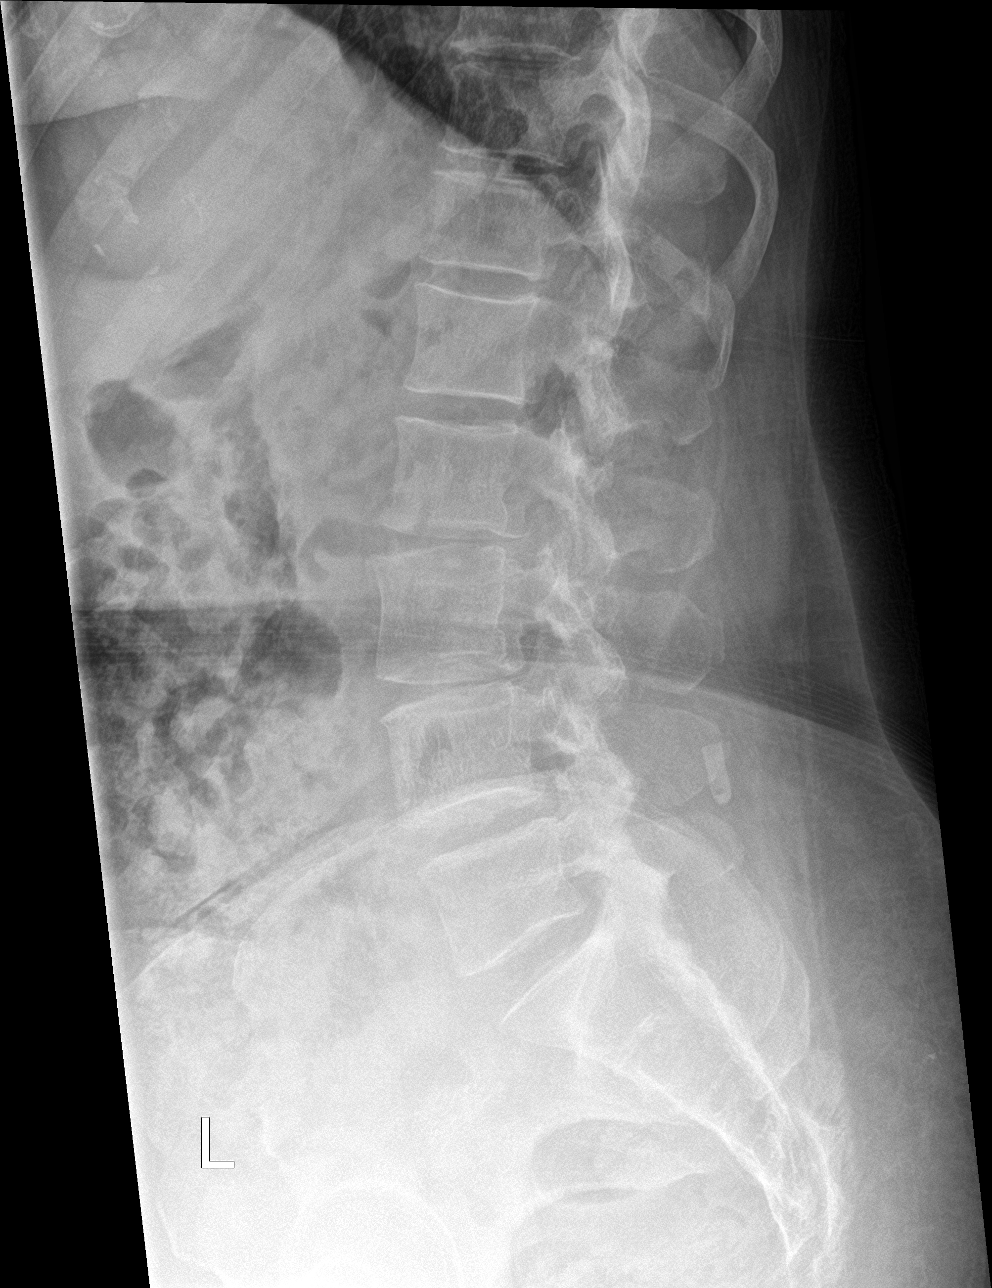

[l-spine spot]
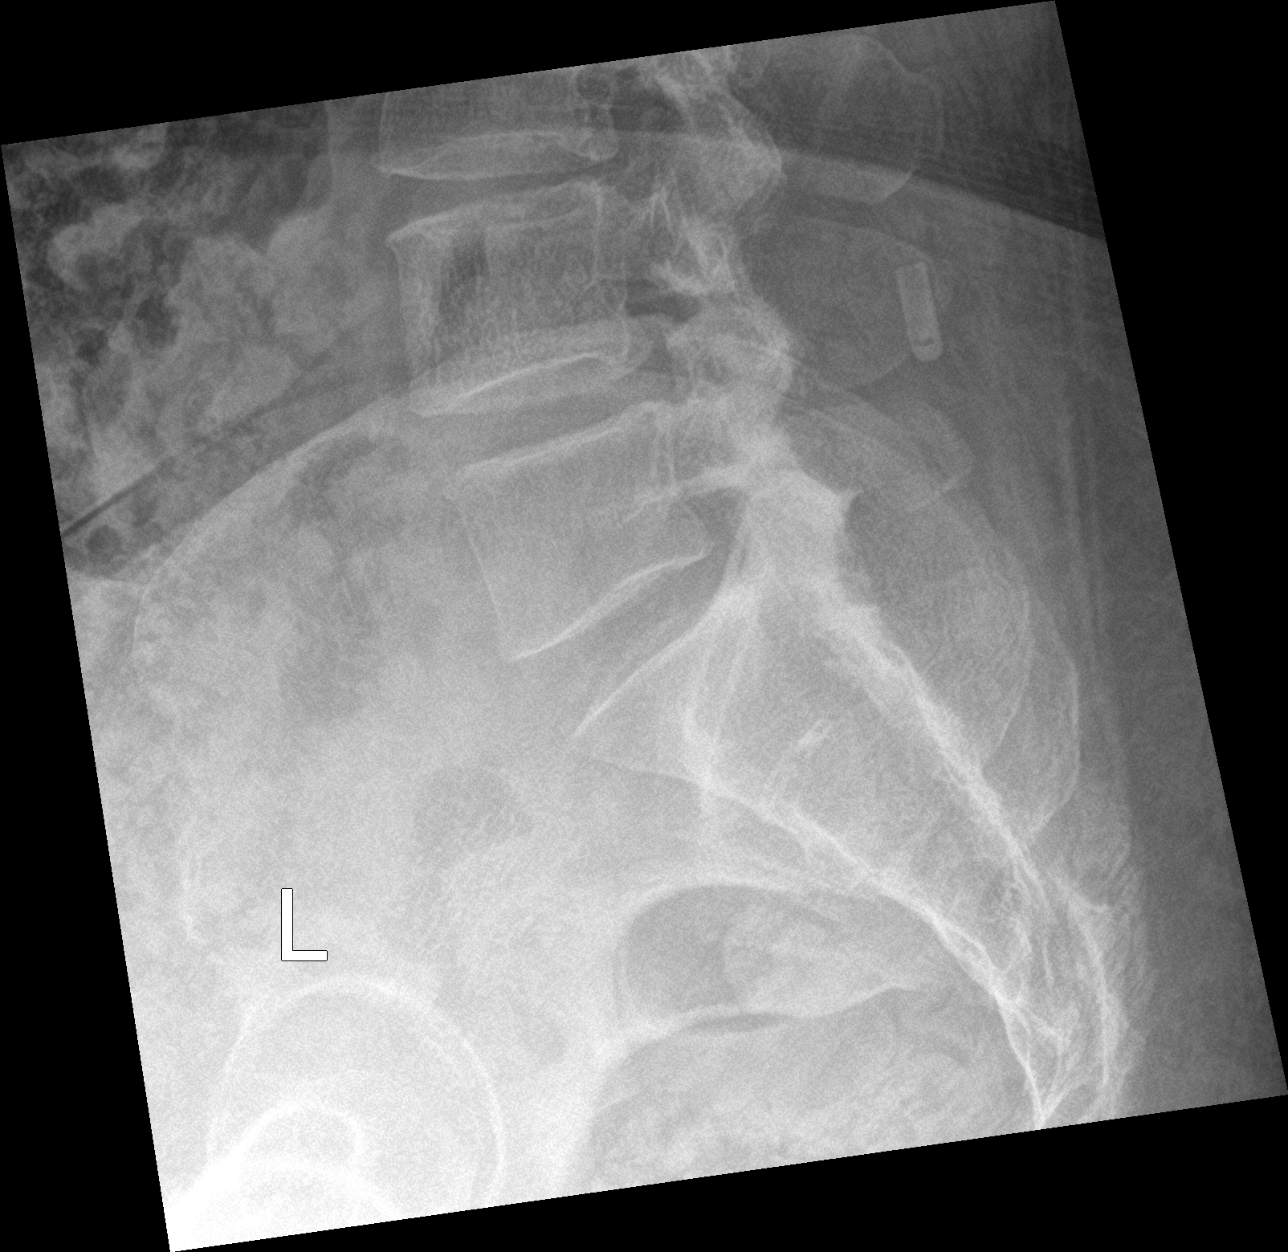

[5 of 5 positions shown; findings below may reference images not displayed]

FINDINGS: Five lumbar type vertebral bodies. 1.6 cm density projects about the
soft tissues posterior and left of the L4-5 intervertebral level.
Maintenance of vertebral body height and alignment. Intervertebral
disc heights are maintained. Facet arthropathy at L4-5 and L5-S1.
IMPRESSION: Spondylosis, without acute osseous abnormality.

Indeterminate soft tissue density projecting about the lower left
paravertebral soft tissues.
